# Patient Record
Sex: Female | Born: 2000 | Race: White | Hispanic: No | Marital: Single | Smoking: Former smoker
Health system: Southern US, Community
[De-identification: ages and names within clinical notes are randomized; demographics above are authoritative.]

## PROBLEM LIST (undated history)

## (undated) DIAGNOSIS — F32A Depression, unspecified: Secondary | ICD-10-CM

## (undated) DIAGNOSIS — J45909 Unspecified asthma, uncomplicated: Secondary | ICD-10-CM

## (undated) DIAGNOSIS — F419 Anxiety disorder, unspecified: Secondary | ICD-10-CM

## (undated) HISTORY — PX: NO PAST SURGERIES: SHX2092

## (undated) HISTORY — DX: Anxiety disorder, unspecified: F41.9

## (undated) HISTORY — DX: Depression, unspecified: F32.A

---
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
## 2019-04-05 ENCOUNTER — Emergency Department (INDEPENDENT_AMBULATORY_CARE_PROVIDER_SITE_OTHER)
Admission: EM | Admit: 2019-04-05 | Discharge: 2019-04-05 | Disposition: A | Discharge status: Home / Self Care | Payer: 59 | Source: Home / Self Care | Attending: Family Medicine | Admitting: Family Medicine

## 2019-04-05 ENCOUNTER — Encounter: Payer: Self-pay | Admitting: *Deleted

## 2019-04-05 ENCOUNTER — Other Ambulatory Visit: Payer: Self-pay

## 2019-04-05 DIAGNOSIS — J020 Streptococcal pharyngitis: Secondary | ICD-10-CM

## 2019-04-05 DIAGNOSIS — R05 Cough: Secondary | ICD-10-CM | POA: Diagnosis not present

## 2019-04-05 DIAGNOSIS — R059 Cough, unspecified: Secondary | ICD-10-CM

## 2019-04-05 HISTORY — DX: Unspecified asthma, uncomplicated: J45.909

## 2019-04-05 LAB — POCT RAPID STREP A (OFFICE): Rapid Strep A Screen: POSITIVE — AB

## 2019-04-05 MED ORDER — PENICILLIN V POTASSIUM 500 MG PO TABS: ORAL_TABLET | ORAL | 0 refills | Status: DC

## 2019-04-05 NOTE — ED Triage Notes (Signed)
Pt c/o cough and sore throat x 2 days. Denies fever.

## 2019-04-05 NOTE — Discharge Instructions (Addendum)
Take plain guaifenesin (1200mg  extended release tabs such as Mucinex) twice daily, with plenty of water, for cough and congestion.  May add Pseudoephedrine (30mg , one or two every 4 to 6 hours) for sinus congestion.  Get adequate rest.   May take Ibuprofen 200mg , 4 tabs every 8 hours with food for sore throat, body aches, etc. Try warm salt water gargles for sore throat.  May take Delsym Cough Suppressant at bedtime for nighttime cough.  Stop all antihistamines for now, and other non-prescription cough/cold preparations.

## 2019-04-05 NOTE — ED Provider Notes (Signed)
[facility]    CSN: [id number] Arrival date & time: 04/05/19  1221      History   Chief Complaint Chief Complaint  Patient presents with  . Cough    HPI Julitza Day is a 18 y.o. female.   Patient awoke with a sore throat 1 week ago that has persisted.  She has been fatigued and felt hot.  Three days ago she developed sinus congestion and mild cough.  She denies pleuritic pain, chest tightness and shortness of breath.  The history is provided by the patient.    Past Medical History:  Diagnosis Date  . Asthma     There are no active problems to display for this patient.   History reviewed. No pertinent surgical history.  OB History   No obstetric history on file.      Home Medications    Prior to Admission medications   Medication Sig Start Date End Date Taking? Authorizing Provider  ALBUTEROL IN Inhale into the lungs.    [provider]  Albuterol Sulfate (PROAIR RESPICLICK) 108 (90 Base) MCG/ACT AEPB Inhale 1-2 puffs into the lungs every 6 (six) hours as needed. 03/31/18   Lurene Shadow, PA-C  penicillin v potassium (VEETID) 500 MG tablet Take one tab by mouth twice daily for 10 days 04/05/19   Lattie Haw, MD    Family History History reviewed. No pertinent family history.  Social History Social History   Tobacco Use  . Smoking status: Never Smoker  . Smokeless tobacco: Never Used  Substance Use Topics  . Alcohol use: No  . Drug use: No     Allergies   Patient has no known allergies.   Review of Systems Review of Systems + sore throat + cough No pleuritic pain No wheezing + nasal congestion + post-nasal drainage No sinus pain/pressure No itchy/red eyes No earache No hemoptysis No SOB No fever, ? chills No nausea No vomiting No abdominal pain No diarrhea No urinary symptoms No skin rash + fatigue No myalgias + headache Used OTC meds without relief  Physical Exam Triage Vital Signs ED Triage  Vitals  Enc Vitals Group     BP 04/05/19 1234 111/75     Pulse Rate 04/05/19 1234 (!) 106     Resp 04/05/19 1234 16     Temp 04/05/19 1234 99 F (37.2 C)     Temp Source 04/05/19 1234 Oral     SpO2 04/05/19 1234 98 %     Weight 04/05/19 1235 110 lb (49.9 kg)     Height 04/05/19 1235 5\' 5"  (1.651 m)     Head Circumference --      Peak Flow --      Pain Score 04/05/19 1235 0     Pain Loc --      Pain Edu? --      Excl. in [place]? --    No data found.  Updated Vital Signs BP 111/75 (BP Location: Right Arm)   Pulse (!) 106   Temp 99 F (37.2 C) (Oral)   Resp 16   Ht 5\' 5"  (1.651 m)   Wt 49.9 kg   LMP 03/06/2019   SpO2 98%   BMI 18.30 kg/m   Visual Acuity Right Eye Distance:   Left Eye Distance:   Bilateral Distance:    Right Eye Near:   Left Eye Near:    Bilateral Near:     Physical Exam Nursing notes and Vital Signs reviewed. Appearance:  Patient appears stated age, and in no acute distress Eyes:  Pupils are equal, round, and reactive to light and accomodation.  Extraocular movement is intact.  Conjunctivae are not inflamed  Ears:  Canals normal.  Tympanic membranes normal.  Nose:  Mildly congested turbinates.  No sinus tenderness.  Pharynx:  Mildly erythematous Neck:  Supple.  Enlarged posterior/lateral nodes are palpated bilaterally, tender to palpation on the left.  Enlarged tender tonsillar nodes. Lungs:  Clear to auscultation.  Breath sounds are equal.  Moving air well. Heart:  Regular rate and rhythm without murmurs, rubs, or gallops.  Abdomen:  Nontender without masses or hepatosplenomegaly.  Bowel sounds are present.  No CVA or flank tenderness.  Extremities:  No edema.  Skin:  No rash present.    UC Treatments / Results  Labs (all labs ordered are listed, but only abnormal results are displayed) Labs Reviewed  POCT RAPID STREP A (OFFICE) - Abnormal; Notable for the following components:      Result Value   Rapid Strep A Screen Positive (*)    All other  components within normal limits  NOVEL CORONAVIRUS, NAA    EKG   Radiology No results found.  Procedures Procedures (including critical care time)  Medications Ordered in UC Medications - No data to display  Initial Impression / Assessment and Plan / UC Course  I have reviewed the triage vital signs and the nursing notes.  Pertinent labs & imaging results that were available during my care of the patient were reviewed by me and considered in my medical decision making (see chart for details).    Suspect viral URI in addition to strep.  Will check COVID19. Begin Pen VK Followup with Family Doctor if not improved in about 10 days   Final Clinical Impressions(s) / UC Diagnoses   Final diagnoses:  Cough  Streptococcus pharyngitis     Discharge Instructions     Take plain guaifenesin (1200mg  extended release tabs such as Mucinex) twice daily, with plenty of water, for cough and congestion.  May add Pseudoephedrine (30mg , one or two every 4 to 6 hours) for sinus congestion.  Get adequate rest.   May take Ibuprofen 200mg , 4 tabs every 8 hours with food for sore throat, body aches, etc. Try warm salt water gargles for sore throat.  May take Delsym Cough Suppressant at bedtime for nighttime cough.  Stop all antihistamines for now, and other non-prescription cough/cold preparations.       ED Prescriptions    Medication Sig Dispense Auth. Provider   penicillin v potassium (VEETID) 500 MG tablet Take one tab by mouth twice daily for 10 days 20 tablet Kandra Nicolas, MD        Kandra Nicolas, MD 04/05/19 1320

## 2019-04-08 LAB — NOVEL CORONAVIRUS, NAA: SARS-CoV-2, NAA: NOT DETECTED

## 2019-05-16 ENCOUNTER — Other Ambulatory Visit: Payer: Self-pay

## 2019-05-16 ENCOUNTER — Emergency Department (INDEPENDENT_AMBULATORY_CARE_PROVIDER_SITE_OTHER)
Admission: EM | Admit: 2019-05-16 | Discharge: 2019-05-16 | Disposition: A | Discharge status: Home / Self Care | Payer: 59 | Source: Home / Self Care

## 2019-05-16 DIAGNOSIS — J029 Acute pharyngitis, unspecified: Secondary | ICD-10-CM | POA: Diagnosis not present

## 2019-05-16 NOTE — ED Triage Notes (Signed)
Pt recently had strep that cleared up. Pt c/o another sore throat that started 2 days ago. Feels similar to the previous strep. Denies fever.

## 2019-05-16 NOTE — Discharge Instructions (Signed)
Covid test is pending.

## 2019-05-16 NOTE — ED Provider Notes (Signed)
[facility]    CSN: [id number] Arrival date & time: 05/16/19  1146      History   Chief Complaint Chief Complaint  Patient presents with  . Sore Throat    HPI Shirley Day is a 18 y.o. female.   The history is provided by the patient. No language interpreter was used.  Sore Throat This is a new problem. The current episode started 2 days ago. The problem occurs constantly. The problem has been gradually worsening. Pertinent negatives include no shortness of breath. Nothing aggravates the symptoms. Nothing relieves the symptoms. She has tried nothing for the symptoms. The treatment provided no relief.   Pt reports she has a sore throat.  Pt had strep a 2 weeks ago.  Pt reports her throat has been sore for 2 days  Past Medical History:  Diagnosis Date  . Asthma     There are no active problems to display for this patient.   History reviewed. No pertinent surgical history.  OB History   No obstetric history on file.      Home Medications    Prior to Admission medications   Medication Sig Start Date End Date Taking? Authorizing Provider  ALBUTEROL IN Inhale into the lungs.    [provider]  Albuterol Sulfate (PROAIR RESPICLICK) 354 (90 Base) MCG/ACT AEPB Inhale 1-2 puffs into the lungs every 6 (six) hours as needed. 03/31/18   Noe Gens, PA-C  penicillin v potassium (VEETID) 500 MG tablet Take one tab by mouth twice daily for 10 days 04/05/19   Kandra Nicolas, MD    Family History History reviewed. No pertinent family history.  Social History Social History   Tobacco Use  . Smoking status: Never Smoker  . Smokeless tobacco: Never Used  Substance Use Topics  . Alcohol use: No  . Drug use: No     Allergies   Patient has no known allergies.   Review of Systems Review of Systems  Respiratory: Negative for shortness of breath.   All other systems reviewed and are negative.    Physical Exam Triage Vital Signs ED  Triage Vitals  Enc Vitals Group     BP 05/16/19 1216 112/73     Pulse Rate 05/16/19 1216 90     Resp 05/16/19 1216 16     Temp 05/16/19 1216 98.5 F (36.9 C)     Temp Source 05/16/19 1216 Oral     SpO2 05/16/19 1216 98 %     Weight 05/16/19 1217 108 lb 0.4 oz (49 kg)     Height 05/16/19 1217 5\' 5"  (1.651 m)     Head Circumference --      Peak Flow --      Pain Score 05/16/19 1217 0     Pain Loc --      Pain Edu? --      Excl. in [place]? --    No data found.  Updated Vital Signs BP 112/73 (BP Location: Right Arm)   Pulse 90   Temp 98.5 F (36.9 C) (Oral)   Resp 16   Ht 5\' 5"  (1.651 m)   Wt 49 kg   SpO2 98%   BMI 17.98 kg/m   Visual Acuity Right Eye Distance:   Left Eye Distance:   Bilateral Distance:    Right Eye Near:   Left Eye Near:    Bilateral Near:     Physical Exam Vitals signs and nursing note reviewed.  Constitutional:  General: She is not in acute distress.    Appearance: She is well-developed.  HENT:     Head: Normocephalic and atraumatic.     Right Ear: Tympanic membrane normal.     Left Ear: Tympanic membrane normal.     Mouth/Throat:     Pharynx: Posterior oropharyngeal erythema present.  Eyes:     Conjunctiva/sclera: Conjunctivae normal.  Neck:     Musculoskeletal: Neck supple.  Cardiovascular:     Rate and Rhythm: Normal rate and regular rhythm.     Heart sounds: No murmur.  Pulmonary:     Effort: Pulmonary effort is normal. No respiratory distress.     Breath sounds: Normal breath sounds.  Abdominal:     Palpations: Abdomen is soft.     Tenderness: There is no abdominal tenderness.  Skin:    General: Skin is warm and dry.  Neurological:     General: No focal deficit present.     Mental Status: She is alert.      UC Treatments / Results  Labs (all labs ordered are listed, but only abnormal results are displayed) Labs Reviewed  STREP A DNA PROBE  NOVEL CORONAVIRUS, NAA  POCT RAPID STREP A (OFFICE)    EKG   Radiology  No results found.  Procedures Procedures (including critical care time)  Medications Ordered in UC Medications - No data to display  Initial Impression / Assessment and Plan / UC Course  I have reviewed the triage vital signs and the nursing notes.  Pertinent labs & imaging results that were available during my care of the patient were reviewed by me and considered in my medical decision making (see chart for details).     MDM: Strep is negative. Covid pending.  Pt advised warm salt water gargles, lozenges  Final Clinical Impressions(s) / UC Diagnoses   Final diagnoses:  Acute pharyngitis, unspecified etiology     Discharge Instructions     Covid test is pending    ED Prescriptions    None     PDMP not reviewed this encounter.  An After Visit Summary was printed and given to the patient.    Elson Areas, [place] 05/16/19 1325

## 2019-05-17 LAB — STREP A DNA PROBE: Group A Strep Probe: NOT DETECTED

## 2019-05-18 LAB — NOVEL CORONAVIRUS, NAA: SARS-CoV-2, NAA: NOT DETECTED

## 2020-08-14 ENCOUNTER — Emergency Department (INDEPENDENT_AMBULATORY_CARE_PROVIDER_SITE_OTHER): Payer: No Typology Code available for payment source

## 2020-08-14 ENCOUNTER — Emergency Department (INDEPENDENT_AMBULATORY_CARE_PROVIDER_SITE_OTHER)
Admission: EM | Admit: 2020-08-14 | Discharge: 2020-08-14 | Disposition: A | Discharge status: Home / Self Care | Payer: No Typology Code available for payment source | Source: Home / Self Care | Attending: Family Medicine | Admitting: Family Medicine

## 2020-08-14 ENCOUNTER — Other Ambulatory Visit: Payer: Self-pay

## 2020-08-14 ENCOUNTER — Encounter: Payer: Self-pay | Admitting: Emergency Medicine

## 2020-08-14 DIAGNOSIS — M940 Chondrocostal junction syndrome [Tietze]: Secondary | ICD-10-CM | POA: Diagnosis not present

## 2020-08-14 DIAGNOSIS — R0789 Other chest pain: Secondary | ICD-10-CM

## 2020-08-14 DIAGNOSIS — J029 Acute pharyngitis, unspecified: Secondary | ICD-10-CM | POA: Diagnosis not present

## 2020-08-14 DIAGNOSIS — Z8709 Personal history of other diseases of the respiratory system: Secondary | ICD-10-CM

## 2020-08-14 MED ORDER — PREDNISONE 20 MG PO TABS: ORAL_TABLET | ORAL | 0 refills | Status: DC

## 2020-08-14 NOTE — ED Provider Notes (Signed)
[facility]    CSN: [id number] Arrival date & time: 08/14/20  0903      History   Chief Complaint Chief Complaint  Patient presents with  . Sore Throat  . Chest Pain    HPI Shirley Day is a 20 y.o. female.   About two weeks ago patient awakened with a sore throat and tightness over her sternum, worse with inspiration.  Her sore throat resolved but she has persistent sternum pain, worse during the past two days, awakening her at night.  Ibuprofen is somewhat helpful.  She feels well otherwise without cough, fever, or URI symptoms.  The history is provided by the patient.    Past Medical History:  Diagnosis Date  . Asthma     There are no problems to display for this patient.   History reviewed. No pertinent surgical history.  OB History   No obstetric history on file.      Home Medications    Prior to Admission medications   Medication Sig Start Date End Date Taking? Authorizing Provider  predniSONE (DELTASONE) 20 MG tablet Take one tab by mouth twice daily for 4 days, then one daily. Take with food. 08/14/20  Yes Lattie Haw, MD  ALBUTEROL IN Inhale into the lungs. Patient not taking: Reported on 08/14/2020    [provider]  Albuterol Sulfate (PROAIR RESPICLICK) 108 (90 Base) MCG/ACT AEPB Inhale 1-2 puffs into the lungs every 6 (six) hours as needed. Patient not taking: Reported on 08/14/2020 03/31/18   Rolla Plate    Family History Family History  Problem Relation Age of Onset  . Hypertension Mother   . Healthy Father     Social History Social History   Tobacco Use  . Smoking status: Current Every Day Smoker    Types: E-cigarettes  . Smokeless tobacco: Never Used  Vaping Use  . Vaping Use: Every day  . Substances: Nicotine, Flavoring  Substance Use Topics  . Alcohol use: No  . Drug use: No     Allergies   Patient has no known allergies.   Review of Systems Review of Systems + sore throat, resolved No  cough No pleuritic pain, but has pain over sternum No wheezing No nasal congestion No post-nasal drainage No sinus pain/pressure No itchy/red eyes No earache No hemoptysis No SOB No fever/chills No nausea No vomiting No abdominal pain No diarrhea No urinary symptoms No skin rash No fatigue No myalgias No headache    Physical Exam Triage Vital Signs ED Triage Vitals  Enc Vitals Group     BP 08/14/20 0943 109/70     Pulse Rate 08/14/20 0943 68     Resp 08/14/20 0943 15     Temp 08/14/20 0943 98.5 F (36.9 C)     Temp Source 08/14/20 0943 Oral     SpO2 08/14/20 0943 100 %     Weight 08/14/20 0946 106 lb (48.1 kg)     Height 08/14/20 0946 5\' 6"  (1.676 m)     Head Circumference --      Peak Flow --      Pain Score 08/14/20 0945 4     Pain Loc --      Pain Edu? --      Excl. in [place]? --    No data found.  Updated Vital Signs BP 109/70 (BP Location: Right Arm)   Pulse 68   Temp 98.5 F (36.9 C) (Oral)   Resp 15   Ht  5\' 6"  (1.676 m)   Wt 48.1 kg   LMP 07/21/2020 (Exact Date)   SpO2 100%   BMI 17.11 kg/m   Visual Acuity Right Eye Distance:   Left Eye Distance:   Bilateral Distance:    Right Eye Near:   Left Eye Near:    Bilateral Near:     Physical Exam Vitals and nursing note reviewed.  Constitutional:      General: She is not in acute distress. HENT:     Head: Normocephalic.     Nose: Nose normal.     Mouth/Throat:     Pharynx: Oropharynx is clear.  Eyes:     Conjunctiva/sclera: Conjunctivae normal.     Pupils: Pupils are equal, round, and reactive to light.  Cardiovascular:     Rate and Rhythm: Normal rate.     Heart sounds: Normal heart sounds.     Comments: Regular rhythm with recurring premature beats Pulmonary:     Breath sounds: Normal breath sounds.  Chest:       Comments: Chest:  Distinct tenderness to palpation over the mid-sternum as noted on diagram.  There is tenderness to palpation over the pectoralis muscles with resisted  contraction of the pectoralis muscles. Abdominal:     Palpations: Abdomen is soft.     Tenderness: There is no abdominal tenderness.  Musculoskeletal:        General: No swelling or tenderness.     Right lower leg: No edema.     Left lower leg: No edema.  Skin:    General: Skin is warm and dry.     Findings: No rash.  Neurological:     Mental Status: She is alert and oriented to person, place, and time.      UC Treatments / Results  Labs (all labs ordered are listed, but only abnormal results are displayed) Labs Reviewed - No data to display  EKG   Radiology DG Chest 2 View  Result Date: 08/14/2020 CLINICAL DATA:  Sore throat and chest tightness upon waking up 2 weeks ago, midline anterior chest/sternal pain, pain increased with inspiration. History childhood asthma EXAM: CHEST - 2 VIEW COMPARISON:  07/21/2016 FINDINGS: Normal heart size, mediastinal contours, and pulmonary vascularity. Lungs clear. No acute infiltrate, pleural effusion, or pneumothorax. Osseous structures unremarkable. IMPRESSION: No acute abnormalities. Electronically Signed   By: 09/18/2016 M.D.   On: 08/14/2020 11:29    Procedures Procedures (including critical care time)  Medications Ordered in UC Medications - No data to display  Initial Impression / Assessment and Plan / UC Course  I have reviewed the triage vital signs and the nursing notes.  Pertinent labs & imaging results that were available during my care of the patient were reviewed by me and considered in my medical decision making (see chart for details).     Reassurance. Begin prednisone burst/taper. Followup with Family Doctor if not improved in about 2 weeks.   Final Clinical Impressions(s) / UC Diagnoses   Final diagnoses:  Costochondritis     Discharge Instructions     Apply ice pack to sternum for 20 to 30 minutes, 3 to 4 times daily  Continue until pain decreases.  Recommend follow-up with a cardiologist for evaluation of  extra heart beats.  If symptoms become significantly worse during the night or over the weekend, proceed to the local emergency room.     ED Prescriptions    Medication Sig Dispense Auth. Provider   predniSONE (DELTASONE) 20 MG tablet  Take one tab by mouth twice daily for 4 days, then one daily. Take with food. 12 tablet Lattie Haw, MD        Lattie Haw, MD 08/15/20 [phone]

## 2020-08-14 NOTE — ED Triage Notes (Addendum)
Sore throat & chest tightness on waking up x 2 weeks  Described as sternal pain - no radiation  Pain increase w/ inspiration Asthma at age 20 - resolved  COVID vaccine Moderna 02/24/20 - no booster

## 2020-08-14 NOTE — Discharge Instructions (Signed)
Apply ice pack to sternum for 20 to 30 minutes, 3 to 4 times daily  Continue until pain decreases.  Recommend follow-up with a cardiologist for evaluation of extra heart beats.  If symptoms become significantly worse during the night or over the weekend, proceed to the local emergency room.

## 2020-08-24 ENCOUNTER — Telehealth: Payer: Self-pay | Admitting: *Deleted

## 2020-08-24 ENCOUNTER — Ambulatory Visit (INDEPENDENT_AMBULATORY_CARE_PROVIDER_SITE_OTHER): Payer: No Typology Code available for payment source | Admitting: Medical-Surgical

## 2020-08-24 ENCOUNTER — Ambulatory Visit (INDEPENDENT_AMBULATORY_CARE_PROVIDER_SITE_OTHER): Payer: No Typology Code available for payment source

## 2020-08-24 ENCOUNTER — Encounter: Payer: Self-pay | Admitting: Medical-Surgical

## 2020-08-24 ENCOUNTER — Other Ambulatory Visit: Payer: Self-pay

## 2020-08-24 VITALS — BP 107/68 | HR 134 | Temp 98.5°F | Ht 65.0 in | Wt 108.0 lb

## 2020-08-24 DIAGNOSIS — Z7689 Persons encountering health services in other specified circumstances: Secondary | ICD-10-CM | POA: Diagnosis not present

## 2020-08-24 DIAGNOSIS — Z114 Encounter for screening for human immunodeficiency virus [HIV]: Secondary | ICD-10-CM

## 2020-08-24 DIAGNOSIS — R002 Palpitations: Secondary | ICD-10-CM | POA: Diagnosis not present

## 2020-08-24 DIAGNOSIS — R079 Chest pain, unspecified: Secondary | ICD-10-CM | POA: Diagnosis not present

## 2020-08-24 DIAGNOSIS — Z1159 Encounter for screening for other viral diseases: Secondary | ICD-10-CM

## 2020-08-24 NOTE — Progress Notes (Signed)
New Patient Office Visit  Subjective:  Patient ID: Shirley Day, female    DOB: 06-04-2001  Age: 20 y.o. MRN: [id number]  CC:  Chief Complaint  Patient presents with  . Establish Care    HPI Shirley Day presents to establish care.   Presented to UC last week for evaluation of chest pressure described as feeling like her heart was being squeezed. Had an EKG which showed sinus arrhythmia with short PR and PACs. No labs drawn. Was diagnosed with costochondritis and given a short burst of prednisone. She has completed the steroids but was told to follow up with her PCP. Today, she endorses no further chest pressure/pain but her heart rate is pretty elevated. She notes that she has trouble gaining weight and lost 30lbs last year abruptly without cause. Has ben experiencing hair loss and cold intolerance.    Past Medical History:  Diagnosis Date  . Asthma    History reviewed. No pertinent surgical history.  Family History  Problem Relation Age of Onset  . Hypertension Mother   . Healthy Father     Social History   Socioeconomic History  . Marital status: Single    Spouse name: Not on file  . Number of children: Not on file  . Years of education: Not on file  . Highest education level: Not on file  Occupational History  . Not on file  Tobacco Use  . Smoking status: Former Smoker    Types: E-cigarettes    Quit date: 07/27/2020    Years since quitting: 0.0  . Smokeless tobacco: Never Used  Vaping Use  . Vaping Use: Every day  . Substances: Nicotine, Flavoring  Substance and Sexual Activity  . Alcohol use: No  . Drug use: Never  . Sexual activity: Yes    Birth control/protection: Condom  Other Topics Concern  . Not on file  Social History Narrative  . Not on file   Social Determinants of Health   Financial Resource Strain: Not on file  Food Insecurity: Not on file  Transportation Needs: Not on file  Physical Activity: Not on file  Stress: Not on file  Social  Connections: Not on file  Intimate Partner Violence: Not on file    ROS Review of Systems  Constitutional: Negative for chills, fatigue and fever.  Eyes: Negative for visual disturbance.  Respiratory: Negative for cough, chest tightness, shortness of breath and wheezing.   Cardiovascular: Positive for chest pain (squeezing her heart) and palpitations. Negative for leg swelling.  Gastrointestinal: Negative for abdominal pain, constipation, diarrhea, nausea and vomiting.  Endocrine: Positive for cold intolerance. Negative for heat intolerance.  Genitourinary: Negative for dysuria, frequency and urgency.  Allergic/Immunologic: Negative for environmental allergies and food allergies.  Neurological: Positive for dizziness (occasional) and headaches (frequently for the past 3 weeks). Negative for light-headedness.  Hematological: Does not bruise/bleed easily.  Psychiatric/Behavioral: Negative for dysphoric mood, self-injury, sleep disturbance and suicidal ideas. The patient is not nervous/anxious.    Objective:   Today's Vitals: BP 107/68   Pulse (!) 134   Temp 98.5 F (36.9 C)   Ht 5\' 5"  (1.651 m)   Wt 108 lb (49 kg)   LMP 08/17/2020   SpO2 95%   BMI 17.97 kg/m   Physical Exam Vitals reviewed.  Constitutional:      General: She is not in acute distress.    Appearance: Normal appearance. She is not ill-appearing.  HENT:     Head: Normocephalic and atraumatic.  Cardiovascular:  Rate and Rhythm: Normal rate and regular rhythm.     Pulses: Normal pulses.     Heart sounds: Normal heart sounds. No murmur heard. No friction rub. No gallop.   Pulmonary:     Effort: Pulmonary effort is normal. No respiratory distress.     Breath sounds: Normal breath sounds. No wheezing.  Skin:    General: Skin is warm and dry.  Neurological:     Mental Status: She is alert and oriented to person, place, and time.  Psychiatric:        Mood and Affect: Mood normal.        Behavior: Behavior  normal.        Thought Content: Thought content normal.        Judgment: Judgment normal.    Assessment & Plan:   1. Encounter to establish care Reviewed available information and discussed health care concerns with patient.   2. Chest pain/palpitations Checking CBC w/diff, CMP, and TSH. EKG in office showing sinus tachycardia with HR of 108, normal rhythm, normal axis. ZioPatch monitor ordered. Referring to cardiology for further evaluation.  - CBC with Differential/Platelet - COMPLETE METABOLIC PANEL WITH GFR - TSH - EKG 12-Lead - LONG TERM MONITOR (3-14 DAYS); Future - Ambulatory referral to Cardiology  Outpatient Encounter Medications as of 08/24/2020  Medication Sig  . ALBUTEROL IN Inhale into the lungs.  . Albuterol Sulfate (PROAIR RESPICLICK) 108 (90 Base) MCG/ACT AEPB Inhale 1-2 puffs into the lungs every 6 (six) hours as needed.  . predniSONE (DELTASONE) 20 MG tablet Take one tab by mouth twice daily for 4 days, then one daily. Take with food.   No facility-administered encounter medications on file as of 08/24/2020.    Follow-up: Return if symptoms worsen or fail to improve.   Thayer Ohm, DNP, APRN, FNP-BC [facility]

## 2020-08-24 NOTE — Telephone Encounter (Signed)
Patient not available/voicemail box full. Called to inform patient Samuel Bouche, NP, requested we set her up with a 14 day patch long term holter monitor.  She has been enrolled for a company call [employer] to ship that monitor to her home via [employer].  Instructions will come with the monitor kit.

## 2020-08-25 LAB — COMPLETE METABOLIC PANEL WITH GFR
AG Ratio: 1.6 (calc) (ref 1.0–2.5)
ALT: 46 U/L — ABNORMAL HIGH (ref 6–29)
AST: 25 U/L (ref 10–30)
Albumin: 4.1 g/dL (ref 3.6–5.1)
Alkaline phosphatase (APISO): 74 U/L (ref 31–125)
BUN: 9 mg/dL (ref 7–25)
CO2: 28 mmol/L (ref 20–32)
Calcium: 9.3 mg/dL (ref 8.6–10.2)
Chloride: 103 mmol/L (ref 98–110)
Creat: 0.62 mg/dL (ref 0.50–1.10)
GFR, Est African American: 149 mL/min/{1.73_m2} (ref >=60)
GFR, Est Non African American: 129 mL/min/{1.73_m2} (ref >=60)
Globulin: 2.5 g/dL (calc) (ref 1.9–3.7)
Glucose, Bld: 85 mg/dL (ref 65–99)
Potassium: 3.8 mmol/L (ref 3.5–5.3)
Sodium: 142 mmol/L (ref 135–146)
Total Bilirubin: 0.3 mg/dL (ref 0.2–1.2)
Total Protein: 6.6 g/dL (ref 6.1–8.1)

## 2020-08-25 LAB — CBC WITH DIFFERENTIAL/PLATELET
Absolute Monocytes: 648 cells/uL (ref 200–950)
Basophils Absolute: 72 cells/uL (ref 0–200)
Basophils Relative: 0.9 %
Eosinophils Absolute: 448 cells/uL (ref 15–500)
Eosinophils Relative: 5.6 %
HCT: 45.2 % — ABNORMAL HIGH (ref 35.0–45.0)
Hemoglobin: 15 g/dL (ref 11.7–15.5)
Lymphs Abs: 1928 cells/uL (ref 850–3900)
MCH: 28.9 pg (ref 27.0–33.0)
MCHC: 33.2 g/dL (ref 32.0–36.0)
MCV: 87.1 fL (ref 80.0–100.0)
MPV: 11.5 fL (ref 7.5–12.5)
Monocytes Relative: 8.1 %
Neutro Abs: 4904 cells/uL (ref 1500–7800)
Neutrophils Relative %: 61.3 %
Platelets: 282 10*3/uL (ref 140–400)
RBC: 5.19 10*6/uL — ABNORMAL HIGH (ref 3.80–5.10)
RDW: 11.8 % (ref 11.0–15.0)
Total Lymphocyte: 24.1 %
WBC: 8 10*3/uL (ref 3.8–10.8)

## 2020-08-25 LAB — TSH: TSH: 1.1 mIU/L

## 2020-08-31 DIAGNOSIS — R002 Palpitations: Secondary | ICD-10-CM | POA: Diagnosis not present

## 2020-08-31 DIAGNOSIS — R079 Chest pain, unspecified: Secondary | ICD-10-CM

## 2020-09-25 ENCOUNTER — Emergency Department (INDEPENDENT_AMBULATORY_CARE_PROVIDER_SITE_OTHER): Payer: No Typology Code available for payment source

## 2020-09-25 ENCOUNTER — Emergency Department
Admission: EM | Admit: 2020-09-25 | Discharge: 2020-09-25 | Disposition: A | Discharge status: Home / Self Care | Payer: No Typology Code available for payment source | Source: Home / Self Care | Attending: Emergency Medicine | Admitting: Emergency Medicine

## 2020-09-25 ENCOUNTER — Other Ambulatory Visit (HOSPITAL_BASED_OUTPATIENT_CLINIC_OR_DEPARTMENT_OTHER): Payer: Self-pay

## 2020-09-25 ENCOUNTER — Other Ambulatory Visit: Payer: Self-pay

## 2020-09-25 DIAGNOSIS — W540XXA Bitten by dog, initial encounter: Secondary | ICD-10-CM

## 2020-09-25 DIAGNOSIS — S61451A Open bite of right hand, initial encounter: Secondary | ICD-10-CM | POA: Diagnosis not present

## 2020-09-25 DIAGNOSIS — S61411A Laceration without foreign body of right hand, initial encounter: Secondary | ICD-10-CM

## 2020-09-25 MED ORDER — IBUPROFEN 600 MG PO TABS
600.0000 mg | ORAL_TABLET | Freq: Four times a day (QID) | ORAL | 0 refills | Status: DC | PRN
  Filled 2020-09-25: qty 20, 5d supply, fill #0

## 2020-09-25 MED ORDER — IBUPROFEN 600 MG PO TABS
600.0000 mg | ORAL_TABLET | Freq: Once | ORAL | Status: AC
  Administered 2020-09-25: 600 mg via ORAL

## 2020-09-25 MED ORDER — HYDROCODONE-ACETAMINOPHEN 5-325 MG PO TABS
2.0000 | ORAL_TABLET | Freq: Four times a day (QID) | ORAL | 0 refills | Status: DC | PRN
  Filled 2020-09-25: qty 12, 2d supply, fill #0

## 2020-09-25 MED ORDER — AMOXICILLIN-POT CLAVULANATE 875-125 MG PO TABS
1.0000 | ORAL_TABLET | Freq: Two times a day (BID) | ORAL | 0 refills | Status: AC
  Filled 2020-09-25: qty 20, 10d supply, fill #0

## 2020-09-25 NOTE — ED Provider Notes (Signed)
HPI  SUBJECTIVE:  Shirley Day is a right-handed 20 y.o. female who presents with a dog bite to her right hand while trying to break up a dog fight.  This occurred 1 hour to arrival.  She was bitten by her own dog and states that all of its immunizations are up-to-date.  She reports constant throbbing pain at the 2 puncture wounds of her right palm.  No foreign body sensation, rash no numbness or tingling.  No limitation of motion of her fingers.  Denies injury to the dorsal aspect of her right hand.  She also has a cracked nail with a small subungual hematoma on her left thumb and a few scrapes on the dorsum of the left hand that is not bothering her.  She tried washing the wounds out with water without improvement of her symptoms.  No aggravating factors.  Past medical history of tachycardia.  No history of diabetes, smoking, immunocompromise, neuropathy.  LMP: Last week.  Denies the possibility of being pregnant.  Completed primary series of tetanus.  Tetanus is up-to-date.  VOH:YWVPXT, Joy, NP   Past Medical History:  Diagnosis Date  . Asthma     Past Surgical History:  Procedure Laterality Date  . NO PAST SURGERIES      Family History  Problem Relation Age of Onset  . Hypertension Mother   . Healthy Father   . Skin cancer Other   . Heart attack Other     Social History   Tobacco Use  . Smoking status: Former Smoker    Types: E-cigarettes    Quit date: 07/27/2020    Years since quitting: 0.1  . Smokeless tobacco: Never Used  Vaping Use  . Vaping Use: Former  Substance Use Topics  . Alcohol use: No  . Drug use: Never    No current facility-administered medications for this encounter.  Current Outpatient Medications:  .  amoxicillin-clavulanate (AUGMENTIN) 875-125 MG tablet, Take 1 tablet by mouth 2 (two) times daily for 10 days., Disp: 20 tablet, Rfl: 0 .  HYDROcodone-acetaminophen (NORCO/VICODIN) 5-325 MG tablet, Take 2 tablets by mouth every 6 (six) hours as needed  for moderate pain., Disp: 12 tablet, Rfl: 0 .  ibuprofen (ADVIL) 600 MG tablet, Take 1 tablet (600 mg total) by mouth every 6 (six) hours as needed., Disp: 20 tablet, Rfl: 0  No Known Allergies   ROS  As noted in HPI.   Physical Exam  BP 108/77 (BP Location: Right Arm)   Pulse 94   Temp 98.4 F (36.9 C)   Resp 20   Ht 5\' 5"  (1.651 m)   Wt 49.9 kg   SpO2 99%   BMI 18.30 kg/m   Constitutional: Well developed, well nourished, no acute distress Eyes:  EOMI, conjunctiva normal bilaterally HENT: Normocephalic, atraumatic,mucus membranes moist Respiratory: Normal inspiratory effort Cardiovascular: Normal rate GI: nondistended skin: No rash, skin intact Musculoskeletal: 1 cm laceration to palm of right hand with fat protruding.  0.5 cm linear laceration immediately next to it.  FDP/FDS intact in all fingers.  Mild localized tenderness at the bite site.  Two-point discrimination intact in all of the fingers.  No swelling.  No bony tenderness.  Rest of the hand exam normal.       Left thumb: Positive small nontender subungual hematoma.     Dorsum left hand: Multiple superficial scratches     Neurologic: Alert & oriented x 3, no focal neuro deficits Psychiatric: Speech and behavior appropriate   ED Course  Medications  ibuprofen (ADVIL) tablet 600 mg (600 mg Oral Given 09/25/20 1158)    Orders Placed This Encounter  Procedures  . DG Hand Complete Right    Standing Status:   Standing    Number of Occurrences:   1    Order Specific Question:   Reason for Exam (SYMPTOM  OR DIAGNOSIS REQUIRED)    Answer:   Dog bite, r/o fracture and foreign body    Order Specific Question:   Is patient pregnant?    Answer:   No  . Apply dressing    Standing Status:   Standing    Number of Occurrences:   1    No results found for this or any previous visit (from the past 24 hour(s)). DG Hand Complete Right  Result Date: 09/25/2020 CLINICAL DATA:  Dog bite.  Laceration to the  palm of the hand. EXAM: RIGHT HAND - COMPLETE 3+ VIEW COMPARISON:  12/21/2011 FINDINGS: No fracture or bone lesion. Joints normally spaced and aligned. Soft tissues are unremarkable.  No radiopaque foreign body. IMPRESSION: Negative. Electronically Signed   By: Amie Portland M.D.   On: 09/25/2020 12:31    ED Clinical Impression  1. Dog bite, initial encounter   2. Bite wound of right hand, initial encounter      ED Assessment/Plan  [place] Narcotic database reviewed for this patient, and feel that the risk/benefit ratio today is favorable for proceeding with a prescription for controlled substance.  No opiate prescriptions in 2 years.  Subungual hematoma left thumb noted, patient states this is not bothering her, thus no intervention done  Reviewed imaging independently.  Normal hand.  See radiology report for full details.  Procedure note: Cleaned area with alcohol.  Used 1.5 cc of 2% plain Xylocaine via local infiltration with adequate anesthesia.  Then had patient scrub her hand extensively with surgical soap and water.  Then debrided it, explored wounds with adequate hemostasis, no foreign body, neurovascular or tendon involvement noted.  Irrigated each wound out with 60 cc of sterile saline.  Pressure dressing placed.  Patient tolerated procedure well.  Patient with 2 puncture wounds from dog bite to her dominant right hand.  No foreign body or bone fracture seen on x-ray.  Home with Augmentin, ibuprofen/Tylenol-containing product, short course of Norco for severe pain.  patient to return here in 2 days for wound check.  ER return precautions given.  Discussed imaging, MDM, treatment plan, and plan for follow-up with patient. Discussed sn/sx that should prompt return to the ED. patient agrees with plan.   Meds ordered this encounter  Medications  . ibuprofen (ADVIL) tablet 600 mg  . amoxicillin-clavulanate (AUGMENTIN) 875-125 MG tablet    Sig: Take 1 tablet by mouth 2 (two) times daily for  10 days.    Dispense:  20 tablet    Refill:  0  . HYDROcodone-acetaminophen (NORCO/VICODIN) 5-325 MG tablet    Sig: Take 2 tablets by mouth every 6 (six) hours as needed for moderate pain.    Dispense:  12 tablet    Refill:  0  . ibuprofen (ADVIL) 600 MG tablet    Sig: Take 1 tablet (600 mg total) by mouth every 6 (six) hours as needed.    Dispense:  20 tablet    Refill:  0      *This clinic note was created using Scientist, clinical (histocompatibility and immunogenetics). Therefore, there may be occasional mistakes despite careful proofreading.  ?    Domenick Gong, MD 09/25/20 1944

## 2020-09-25 NOTE — Discharge Instructions (Addendum)
600 mg of ibuprofen combined with 1000 mg of Tylenol for mild to moderate pain or 600 mg of ibuprofen combined with 1-2 Norco for severe pain.  Finish the Augmentin, even if you feel better.  Keep this clean with soap and water.  Keep it covered while you are out and about and at night.  Give it some dry time so that it can heal.  We will see you in 2 days.

## 2020-09-25 NOTE — ED Triage Notes (Signed)
Pt presents to Urgent Care with dog bite to R hand. Reports she was trying to break up a fight between her dog and the neighbor's dog and was bit by her dog. Laceration noted to R palm and abrasion to R thumb.

## 2020-09-27 ENCOUNTER — Encounter: Payer: Self-pay | Admitting: Emergency Medicine

## 2020-09-27 ENCOUNTER — Emergency Department (INDEPENDENT_AMBULATORY_CARE_PROVIDER_SITE_OTHER)
Admission: EM | Admit: 2020-09-27 | Discharge: 2020-09-27 | Disposition: A | Discharge status: Home / Self Care | Payer: No Typology Code available for payment source | Source: Home / Self Care

## 2020-09-27 ENCOUNTER — Other Ambulatory Visit: Payer: Self-pay

## 2020-09-27 DIAGNOSIS — W540XXD Bitten by dog, subsequent encounter: Secondary | ICD-10-CM

## 2020-09-27 DIAGNOSIS — Z5189 Encounter for other specified aftercare: Secondary | ICD-10-CM | POA: Diagnosis not present

## 2020-09-27 NOTE — ED Notes (Signed)
R hand CDI - no bleeding noted - pt educated about timing of her antibiotic, has not taken 1st dose today- - no redness or streaks noted to R hand - bruising noted - pt educated on elevation to decrease swellling

## 2020-09-27 NOTE — Discharge Instructions (Addendum)
Area is healing well  Continue Augmentin  Follow up with this office or with primary care for increased swelling, redness, tenderness, warmth, drainage from the area.  Follow up in the ER for red streaking up your arm, high fever, trouble swallowing, trouble breathing, other concerning symptoms.

## 2020-09-27 NOTE — ED Provider Notes (Signed)
[facility]    CSN: [id number] Arrival date & time: 09/27/20  1245      History   Chief Complaint Chief Complaint  Patient presents with  . Wound Check    Dog bite R hand     HPI Shirley Day is a 20 y.o. female.   Reports that she is here to recheck the wound to her R hand from a dog bite x 2 days ago. Was seen in this office with normal xray of the hand and treated with Augmentin and Norco. Reports that she is still taking the antibiotic. Has acrylic fingernail to the L thumb with bruising of the nail underneath and states that this area is the most painful. Denies pain to the R hand, denies erythema, swelling, heat, tenderness, drainage from the area.  ROS per HPI  The history is provided by the patient.  Wound Check    Past Medical History:  Diagnosis Date  . Asthma     There are no problems to display for this patient.   Past Surgical History:  Procedure Laterality Date  . NO PAST SURGERIES      OB History   No obstetric history on file.      Home Medications    Prior to Admission medications   Medication Sig Start Date End Date Taking? Authorizing Provider  amoxicillin-clavulanate (AUGMENTIN) 875-125 MG tablet Take 1 tablet by mouth 2 (two) times daily for 10 days. 09/25/20 10/05/20 Yes Domenick Gong, MD  HYDROcodone-acetaminophen (NORCO/VICODIN) 5-325 MG tablet Take 2 tablets by mouth every 6 (six) hours as needed for moderate pain. 09/25/20  Yes Domenick Gong, MD  ibuprofen (ADVIL) 600 MG tablet Take 1 tablet (600 mg total) by mouth every 6 (six) hours as needed. 09/25/20  Yes Domenick Gong, MD    Family History Family History  Problem Relation Age of Onset  . Hypertension Mother   . Healthy Father   . Skin cancer Other   . Heart attack Other     Social History Social History   Tobacco Use  . Smoking status: Former Smoker    Types: E-cigarettes    Quit date: 07/27/2020    Years since quitting: 0.1  . Smokeless  tobacco: Never Used  Vaping Use  . Vaping Use: Former  Substance Use Topics  . Alcohol use: No  . Drug use: Never     Allergies   Patient has no known allergies.   Review of Systems Review of Systems   Physical Exam Triage Vital Signs ED Triage Vitals  Enc Vitals Group     BP      Pulse      Resp      Temp      Temp src      SpO2      Weight      Height      Head Circumference      Peak Flow      Pain Score      Pain Loc      Pain Edu?      Excl. in GC?    No data found.  Updated Vital Signs BP 101/69 (BP Location: Left Arm)   Pulse 95   Temp 98.3 F (36.8 C) (Oral)   Resp 16   LMP 09/20/2020 (Exact Date)   SpO2 95%   Visual Acuity Right Eye Distance:   Left Eye Distance:   Bilateral Distance:    Right Eye Near:   Left Eye  Near:    Bilateral Near:     Physical Exam Vitals and nursing note reviewed.  Constitutional:      General: She is not in acute distress.    Appearance: Normal appearance. She is well-developed. She is not ill-appearing.  HENT:     Head: Normocephalic and atraumatic.     Nose: Nose normal.     Mouth/Throat:     Mouth: Mucous membranes are moist.     Pharynx: Oropharynx is clear.  Eyes:     Extraocular Movements: Extraocular movements intact.     Conjunctiva/sclera: Conjunctivae normal.     Pupils: Pupils are equal, round, and reactive to light.  Cardiovascular:     Rate and Rhythm: Normal rate and regular rhythm.     Heart sounds: No murmur heard.   Pulmonary:     Effort: Pulmonary effort is normal. No respiratory distress.     Breath sounds: Normal breath sounds.  Abdominal:     Palpations: Abdomen is soft.     Tenderness: There is no abdominal tenderness.  Musculoskeletal:     Cervical back: Normal range of motion and neck supple.  Skin:    General: Skin is warm and dry.     Capillary Refill: Capillary refill takes less than 2 seconds.     Findings: Bruising and wound present.     Comments: Wound to R palmar  surface of the hand, 2 puncture wounds, no erythema, heat, tenderness, drainage from the area.  Bruising to the L thumb nail.  Neurological:     General: No focal deficit present.     Mental Status: She is alert and oriented to person, place, and time.  Psychiatric:        Mood and Affect: Mood normal.        Behavior: Behavior normal.        Thought Content: Thought content normal.      UC Treatments / Results  Labs (all labs ordered are listed, but only abnormal results are displayed) Labs Reviewed - No data to display  EKG   Radiology No results found.  Procedures Procedures (including critical care time)  Medications Ordered in UC Medications - No data to display  Initial Impression / Assessment and Plan / UC Course  I have reviewed the triage vital signs and the nursing notes.  Pertinent labs & imaging results that were available during my care of the patient were reviewed by me and considered in my medical decision making (see chart for details).    Puncture Wound Dog Bite  Area appears to be healing well without complication Continue Augmentin as prescribed Follow up with this office or with primary care for increased swelling, redness, tenderness, warmth, drainage from the area.  Follow up in the ER for red streaking up your hand, high fever, trouble swallowing, trouble breathing, other concerning symptoms.   Final Clinical Impressions(s) / UC Diagnoses   Final diagnoses:  Dog bite, subsequent encounter  Visit for wound check     Discharge Instructions     Area is healing well  Continue Augmentin  Follow up with this office or with primary care for increased swelling, redness, tenderness, warmth, drainage from the area.  Follow up in the ER for red streaking up your arm, high fever, trouble swallowing, trouble breathing, other concerning symptoms.      ED Prescriptions    None     PDMP not reviewed this encounter.   Moshe Cipro, NP 09/27/20 1431

## 2020-10-01 ENCOUNTER — Encounter: Payer: Self-pay | Admitting: Family Medicine

## 2020-10-01 ENCOUNTER — Ambulatory Visit (INDEPENDENT_AMBULATORY_CARE_PROVIDER_SITE_OTHER): Payer: No Typology Code available for payment source | Admitting: Family Medicine

## 2020-10-01 ENCOUNTER — Other Ambulatory Visit: Payer: Self-pay

## 2020-10-01 VITALS — BP 101/69 | HR 97 | Temp 98.5°F

## 2020-10-01 DIAGNOSIS — S61451A Open bite of right hand, initial encounter: Secondary | ICD-10-CM | POA: Diagnosis not present

## 2020-10-01 DIAGNOSIS — W540XXA Bitten by dog, initial encounter: Secondary | ICD-10-CM

## 2020-10-01 NOTE — Progress Notes (Addendum)
Acute Office Visit  Subjective:    Patient ID: Shirley Day, female    DOB: 02/03/2001, 20 y.o.   MRN: [id number]  No chief complaint on file.   HPI Patient is in today for dog bite.   Patient's hand was  bit by her dog on 09/25/20 while trying to break up a fight between her dog and the neighbor's dog. Her dog is up-to-date on immunizations. She was seen at urgent care the same day and wound was irrigated and she was given Augmentin and hydrocodone (see their note for pictures). 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 She has only been taking the hydrocodone once a day and then using the ibuprofen the rest of the time. She is also trying to ice it and elevate as able. She denies any bleeding, drainage, erythema, or increase in swelling - reports swelling is starting to go down nicely. She has noticed some itching and tingling as scabs are forming. She has full sensation in her fingers, but reports sometimes her pinky feels funny when she holds her hand straight up - seems to be getting a little better as time passes. Told her to keep an eye on this and let us know if anything persists or worsens.  Neighbor and her dog fighting, tried to separate. Got bit by her dog - up to date on immuizations/rabies. UC gave Augmentin, still taking. Aslo hydrocodone and ibuprofenn. Pain is currently about 6/10 throbbing to right hand.    Past Medical History:  Diagnosis Date  . Asthma     Past Surgical History:  Procedure Laterality Date  . NO PAST SURGERIES      Family History  Problem Relation Age of Onset  . Hypertension Mother   . Healthy Father   . Skin cancer Other   . Heart attack Other     Social History   Socioeconomic History  . Marital status: Single    Spouse name: Not on file  . Number of children: Not  on file  . Years of education: Not on file  . Highest education level: Not on file  Occupational History  . Not on file  Tobacco Use  . Smoking status: Former Smoker    Types: E-cigarettes    Quit date: 07/27/2020    Years since quitting: 0.1  . Smokeless tobacco: Never Used  Vaping Use  . Vaping Use: Former  Substance and Sexual Activity  . Alcohol use: No  . Drug use: Never  . Sexual activity: Yes    Birth control/protection: Condom  Other Topics Concern  . Not on file  Social History Narrative  . Not on file   Social Determinants of Health   Financial Resource Strain: Not on file  Food Insecurity: Not on file  Transportation Needs: Not on file  Physical Activity: Not on file  Stress: Not on file  Social Connections: Not on file  Intimate Partner Violence: Not on file    Outpatient Medications Prior to Visit  Medication Sig Dispense Refill  . amoxicillin-clavulanate (AUGMENTIN) 875-125 MG tablet Take 1 tablet by mouth 2 (two) times daily for 10 days. 20 tablet 0  . HYDROcodone-acetaminophen (NORCO/VICODIN) 5-325 MG tablet Take 2 tablets by mouth every 6 (six) hours as needed for moderate pain. 12 tablet 0  .  ibuprofen (ADVIL) 600 MG tablet Take 1 tablet (600 mg total) by mouth every 6 (six) hours as needed. 20 tablet 0   No facility-administered medications prior to visit.    No Known Allergies  Review of Systems All review of systems negative except what is listed in the HPI     Objective:    Physical Exam Vitals reviewed.  Constitutional:      Appearance: Normal appearance. She is normal weight.  Skin:    General: Skin is warm and dry.     Findings: No rash.     Comments: R palm with scabs from bite, no extensive erythema, mild edema, but improved significantly per patient, no extensive bruising, no drainage. Full sensation in all fingers,  Neurological:     Mental Status: She is alert.     LMP 09/20/2020 (Exact Date)  Wt Readings from Last 3  Encounters:  09/25/20 110 lb (49.9 kg)  08/24/20 108 lb (49 kg)  08/14/20 106 lb (48.1 kg)    Health Maintenance Due  Topic Date Due  . Hepatitis C Screening  Never done  . HIV Screening  Never done  . HPV VACCINES (3 - 3-dose series) 01/12/2015  . PAP-Cervical Cytology Screening  Never done  . PAP SMEAR-Modifier  Never done  . COVID-19 Vaccine (3 - Booster for Moderna series) 08/23/2020       Topic Date Due  . HPV VACCINES (3 - 3-dose series) 01/12/2015     Lab Results  Component Value Date   TSH 1.10 08/24/2020   Lab Results  Component Value Date   WBC 8.0 08/24/2020   HGB 15.0 08/24/2020   HCT 45.2 (H) 08/24/2020   MCV 87.1 08/24/2020   PLT 282 08/24/2020   Lab Results  Component Value Date   NA 142 08/24/2020   K 3.8 08/24/2020   CO2 28 08/24/2020   GLUCOSE 85 08/24/2020   BUN 9 08/24/2020   CREATININE 0.62 08/24/2020   BILITOT 0.3 08/24/2020   AST 25 08/24/2020   ALT 46 (H) 08/24/2020   PROT 6.6 08/24/2020   CALCIUM 9.3 08/24/2020   No results found for: CHOL No results found for: HDL No results found for: LDLCALC No results found for: TRIG No results found for: CHOLHDL No results found for: OZHY8M     Assessment & Plan:   1. Open bite of right hand without foreign body, initial encounter 2. Dog bite, initial encounter  Patient wanted to follow-up for the dog bite in her right hand. No signs of infection, but encouraged her to finish out the antibiotic. She uses her hands a lot at work (typing, carrying boxes, filing, etc) and states her hand has been throbbing the past 2 days - she reports the pain medicine seems to help and is not making her drowsy. X-ray was negative 09/25/20. Recommend she continue pain management regimen including oral analgesics, ice, elevation, and rest - she has been having a hard time resting it due to work. Encouraged her to ask for no right-handed duty tomorrow or take the day off so that she has the next 3 days or so to  truly rest it. Educated on signs and symptoms that would require urgent evaluation.   Follow-up if symptoms worsen or fail to improve.    Clayborne Dana, NP

## 2020-10-03 ENCOUNTER — Encounter: Payer: Self-pay | Admitting: Medical-Surgical

## 2020-10-05 MED ORDER — METOPROLOL TARTRATE 25 MG PO TABS: 12.5000 mg | ORAL_TABLET | Freq: Two times a day (BID) | ORAL | 0 refills | Status: DC | PRN

## 2020-10-15 ENCOUNTER — Ambulatory Visit: Payer: No Typology Code available for payment source | Admitting: Cardiology

## 2020-10-29 ENCOUNTER — Ambulatory Visit: Payer: No Typology Code available for payment source | Admitting: Cardiology

## 2020-10-29 NOTE — Progress Notes (Signed)
Cardiology Office Note   Date:  11/01/2020   ID:  Shirley Day, DOB [birth year], MRN [id number]  PCP:  Christen Butter, NP  Cardiologist:   None Referring:  Christen Butter, NP  Chief Complaint  Patient presents with  . Palpitations           History of Present Illness: Shirley Day is a 20 y.o. female who presents for evaluation of palpitations.  She was in urgent care in March and I reviewed these records for this appointment.  She had some chest discomfort that was felt ultimately to be costochondritis.  She was treated with prednisone which may have helped for short period but not long-term.  Eventually seem to go away.  She is not describing that discomfort now.  It was a sharp 8 out of 10 discomfort midsternal that would come and go lasting several minutes at a time.  The squeezing.  There were no associated symptoms.  She is also had palpitations.  These been going on for couple of years.  She will wake up often times with her heart beating fast and strong.  She did have a monitor which I was able to review.  She had premature ventricular contractions at times in a bigeminal pattern.  There was 1 run of 6 beats of nonsustained ventricular tachycardia.  She often felt these premature beats.  She has not had any syncope.  She was started on a beta-blocker but she is only been taking this rarely and as needed.  She is not overly active but she occasionally will play some soccer with her sister.  With this she denies any cardiovascular symptoms.  She is not having any chest pressure, neck or arm discomfort.  She is not having any new shortness of breath, PND or orthopnea.  Of note she did have weight loss couple of years ago that was somewhat traumatic. She was having some mental health issues at that time.  She has had normal potassium and TSH recently.  Past Medical History:  Diagnosis Date  . Asthma     Past Surgical History:  Procedure Laterality Date  . NO PAST SURGERIES        Current Outpatient Medications  Medication Sig Dispense Refill  . ibuprofen (ADVIL) 600 MG tablet Take 1 tablet (600 mg total) by mouth every 6 (six) hours as needed. 20 tablet 0  . metoprolol tartrate (LOPRESSOR) 25 MG tablet Take 0.5 tablets (12.5 mg total) by mouth 2 (two) times daily as needed (palpitations). 30 tablet 0   No current facility-administered medications for this visit.    Allergies:   Patient has no known allergies.    Social History:  The patient  reports that she quit smoking about 3 months ago. Her smoking use included e-cigarettes. She has never used smokeless tobacco. She reports that she does not drink alcohol and does not use drugs.   Family History:  The patient's family history includes Healthy in her father; Heart attack in an other family member; Hypertension in her mother; Skin cancer in an other family member.    ROS:  Please see the history of present illness.   Otherwise, review of systems are positive for none.   All other systems are reviewed and negative.    PHYSICAL EXAM: VS:  BP 100/63   Pulse 90   Ht 5\' 5"  (1.651 m)   Wt 105 lb 12.8 oz (48 kg)   SpO2 98%   BMI 17.61 kg/m  ,  BMI Body mass index is 17.61 kg/m. GENERAL:  Well appearing HEENT:  Pupils equal round and reactive, fundi not visualized, oral mucosa unremarkable NECK:  No jugular venous distention, waveform within normal limits, carotid upstroke brisk and symmetric, no bruits, no thyromegaly LYMPHATICS:  No cervical, inguinal adenopathy LUNGS:  Clear to auscultation bilaterally BACK:  No CVA tenderness CHEST:  Unremarkable HEART:  PMI not displaced or sustained,S1 and S2 within normal limits, no S3, no S4, no clicks, no rubs, no murmurs ABD:  Flat, positive bowel sounds normal in frequency in pitch, no bruits, no rebound, no guarding, no midline pulsatile mass, no hepatomegaly, no splenomegaly EXT:  2 plus pulses throughout, no edema, no cyanosis no clubbing SKIN:  No rashes  no nodules NEURO:  Cranial nerves II through XII grossly intact, motor grossly intact throughout, positive joint laxity PSYCH:  Cognitively intact, oriented to person place and time    EKG:  EKG is ordered today. The ekg ordered today demonstrates sinus rhythm, rate 90, axis within normal limits, intervals within normal limits, no acute ST-T wave changes.   Recent Labs: 08/24/2020: ALT 46; BUN 9; Creat 0.62; Hemoglobin 15.0; Platelets 282; Potassium 3.8; Sodium 142; TSH 1.10    Lipid Panel No results found for: CHOL, TRIG, HDL, CHOLHDL, VLDL, LDLCALC, LDLDIRECT    Wt Readings from Last 3 Encounters:  10/30/20 105 lb 12.8 oz (48 kg)  09/25/20 110 lb (49.9 kg)  08/24/20 108 lb (49 kg)      Other studies Reviewed: Additional studies/ records that were reviewed today include: Urgent care, event monitor strips personally reviewed. Review of the above records demonstrates:  Please see elsewhere in the note.     ASSESSMENT AND PLAN:  PVCs: She has had PVCs and nonsustained VT was noted.  I am going to check a magnesium, T3 and T4.  I will also follow-up with an echocardiogram.  She can continue with as needed beta-blocker.  Further evaluation will be based on these results and further symptoms.   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  I will be structurally assessing her heart as above.   Current medicines are reviewed at length with the patient today.  The patient does not have concerns regarding medicines.  The following changes have been made:  no change  Labs/ tests ordered today include:   Orders Placed This Encounter  Procedures  . T3  . T4  . Magnesium  . EKG 12-Lead  . ECHOCARDIOGRAM COMPLETE     Disposition:   FU with me in six weeks.     Signed, Rollene Rotunda, MD  11/01/2020 11:30 AM    [facility]

## 2020-10-30 ENCOUNTER — Other Ambulatory Visit: Payer: Self-pay

## 2020-10-30 ENCOUNTER — Encounter: Payer: Self-pay | Admitting: Cardiology

## 2020-10-30 ENCOUNTER — Ambulatory Visit (INDEPENDENT_AMBULATORY_CARE_PROVIDER_SITE_OTHER): Payer: No Typology Code available for payment source | Admitting: Cardiology

## 2020-10-30 VITALS — BP 100/63 | HR 90 | Ht 65.0 in | Wt 105.8 lb

## 2020-10-30 DIAGNOSIS — R002 Palpitations: Secondary | ICD-10-CM

## 2020-10-30 DIAGNOSIS — I472 Ventricular tachycardia: Secondary | ICD-10-CM

## 2020-10-30 DIAGNOSIS — I4729 Other ventricular tachycardia: Secondary | ICD-10-CM

## 2020-10-30 NOTE — Patient Instructions (Addendum)
Medication Instructions:  NO CHANGES   *If you need a refill on your cardiac medications before your next appointment, please call your pharmacy*  Lab Work: NEXT WEEK WHEN YOU GO FOR ECHO   If you have labs (blood work) drawn today and your tests are completely normal, you will receive your results only by: Marland Kitchen MyChart Message (if you have MyChart) OR . A paper copy in the mail If you have any lab test that is abnormal or we need to change your treatment, we will call you to review the results.  Testing/Procedures: Your physician has requested that you have an echocardiogram. Echocardiography is a painless test that uses sound waves to create images of your heart. It provides your doctor with information about the size and shape of your heart and how well your heart's chambers and valves are working. This procedure takes approximately one hour. There are no restrictions for this procedure. [facility] AT [address]  MAY 24 AT 11:35 AM   Follow-Up:   12/11/2020 AT 10:20 AM WITH DR [facility]   Other Instructions Echocardiogram An echocardiogram is a test that uses sound waves (ultrasound) to produce images of the heart. Images from an echocardiogram can provide important information about:  Heart size and shape.  The size and thickness and movement of your heart's walls.  Heart muscle function and strength.  Heart valve function or if you have stenosis. Stenosis is when the heart valves are too narrow.  If blood is flowing backward through the heart valves (regurgitation).  A tumor or infectious growth around the heart valves.  Areas of heart muscle that are not working well because of poor blood flow or injury from a heart attack.  Aneurysm detection. An aneurysm is a weak or damaged part of an artery wall. The wall bulges out from the normal force of blood pumping through the body. Tell a health care provider about:  Any allergies you have.  All medicines you  are taking, including vitamins, herbs, eye drops, creams, and over-the-counter medicines.  Any blood disorders you have.  Any surgeries you have had.  Any medical conditions you have.  Whether you are pregnant or may be pregnant. What are the risks? Generally, this is a safe test. However, problems may occur, including an allergic reaction to dye (contrast) that may be used during the test. What happens before the test? No specific preparation is needed. You may eat and drink normally. What happens during the test?  You will take off your clothes from the waist up and put on a hospital gown.  Electrodes or electrocardiogram (ECG)patches may be placed on your chest. The electrodes or patches are then connected to a device that monitors your heart rate and rhythm.  You will lie down on a table for an ultrasound exam. A gel will be applied to your chest to help sound waves pass through your skin.  A handheld device, called a transducer, will be pressed against your chest and moved over your heart. The transducer produces sound waves that travel to your heart and bounce back (or "echo" back) to the transducer. These sound waves will be captured in real-time and changed into images of your heart that can be viewed on a video monitor. The images will be recorded on a computer and reviewed by your health care provider.  You may be asked to change positions or hold your breath for a short time. This makes it easier to get  different views or better views of your heart.  In some cases, you may receive contrast through an IV in one of your veins. This can improve the quality of the pictures from your heart. The procedure may vary among health care providers and hospitals.   What can I expect after the test? You may return to your normal, everyday life, including diet, activities, and medicines, unless your health care provider tells you not to do that. Follow these instructions at home:  It is up  to you to get the results of your test. Ask your health care provider, or the department that is doing the test, when your results will be ready.  Keep all follow-up visits. This is important. Summary  An echocardiogram is a test that uses sound waves (ultrasound) to produce images of the heart.  Images from an echocardiogram can provide important information about the size and shape of your heart, heart muscle function, heart valve function, and other possible heart problems.  You do not need to do anything to prepare before this test. You may eat and drink normally.  After the echocardiogram is completed, you may return to your normal, everyday life, unless your health care provider tells you not to do that. This information is not intended to replace advice given to you by your health care provider. Make sure you discuss any questions you have with your health care provider. Document Revised: 01/21/2020 Document Reviewed: 01/21/2020 Elsevier Patient Education  2021 [employer].

## 2020-11-01 ENCOUNTER — Encounter: Payer: Self-pay | Admitting: Cardiology

## 2020-11-03 ENCOUNTER — Other Ambulatory Visit: Payer: Self-pay

## 2020-11-03 ENCOUNTER — Other Ambulatory Visit: Payer: No Typology Code available for payment source | Admitting: *Deleted

## 2020-11-03 ENCOUNTER — Ambulatory Visit (HOSPITAL_COMMUNITY): Payer: No Typology Code available for payment source | Attending: Internal Medicine

## 2020-11-03 DIAGNOSIS — I472 Ventricular tachycardia: Secondary | ICD-10-CM | POA: Insufficient documentation

## 2020-11-03 DIAGNOSIS — R002 Palpitations: Secondary | ICD-10-CM

## 2020-11-03 DIAGNOSIS — I4729 Other ventricular tachycardia: Secondary | ICD-10-CM

## 2020-11-03 LAB — ECHOCARDIOGRAM COMPLETE
Area-P 1/2: 5.66 cm2
S' Lateral: 2.5 cm

## 2020-11-03 LAB — T3: T3, Total: 157 ng/dL (ref 71–180)

## 2020-11-03 LAB — T4: T4, Total: 9.2 ug/dL (ref 4.5–12.0)

## 2020-11-03 LAB — MAGNESIUM: Magnesium: 2.2 mg/dL (ref 1.6–2.3)

## 2020-12-07 ENCOUNTER — Ambulatory Visit (INDEPENDENT_AMBULATORY_CARE_PROVIDER_SITE_OTHER): Payer: No Typology Code available for payment source | Admitting: Medical-Surgical

## 2020-12-07 ENCOUNTER — Other Ambulatory Visit: Payer: Self-pay

## 2020-12-07 ENCOUNTER — Encounter: Payer: Self-pay | Admitting: Medical-Surgical

## 2020-12-07 VITALS — BP 99/65 | HR 91 | Temp 98.6°F | Ht 65.0 in | Wt 104.0 lb

## 2020-12-07 DIAGNOSIS — F418 Other specified anxiety disorders: Secondary | ICD-10-CM | POA: Diagnosis not present

## 2020-12-07 MED ORDER — BUSPIRONE HCL 5 MG PO TABS: 5.0000 mg | ORAL_TABLET | Freq: Two times a day (BID) | ORAL | 1 refills | Status: DC | PRN

## 2020-12-07 NOTE — Patient Instructions (Signed)
Reasons why it is important to allow yourself to process and experience true feelings: When you numb sadness, you also numb happiness and Nathanyal Ashmead. Struggling with your emotions often leads to more suffering. Processing and experiencing your feelings is a part of having a full life.    Coping skills are things we can do to make ourselves feel better when we are going through difficult times. Examples of coping skills include:  Take a deep breath Count to 20 Listen to music Call a friend Take a walk Read a book Do a puzzle Meditate Journal Exercise Stretch Sing Bake Knit Go outside Garden Pray Color Send a note Take a bath Watch a movie Pet an animal Visit a friend Be alone in a quiet place   When your anxiety gets worse, try these grounding techniques:      If you need additional help, please contact your primary care provider or call one of the resources listed below:  [facility]  24-hour HelpLine at 336-832-9700 or 800-711-2635 [address] [place], [place] [address]  National [facility] Network 800-SUICIDE or 800-784-2433  The National Suicide Prevention Lifeline 800-273-TALK or 800-273-8255  [facility] https://www.celebraterecovery.com/   

## 2020-12-07 NOTE — Progress Notes (Signed)
Subjective:    CC: Depression/anxiety  HPI: Pleasant 20 year old female presenting today for discussion of depression and anxiety.  Notes that she has been having difficulty for several months.  She has not taken any medications for this before.  She has done a couple of counseling sessions when she was in college and felt this may have been somewhat helpful.  She has difficulty falling asleep and notes that at times she is afraid to miss something.  Once asleep, she has difficulty with sleeping too much and often is able to sleep for 12 hours at a time.  She has not tried any over-the-counter or herbal options and has never been prescribed medication to help with sleep.  She does endorse poor appetite intermittently and notes this has been happening over the last couple of years.  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  She does have to do 2 years at [employer] [place] college to get her prerequisites and then will need 2 years at [employer] before she finishes that program.  She is not currently doing classes for the summer but will start back in the fall.  Feels that she has poor motivation and is having difficulty getting things done.  Denies SI/HI.  I reviewed the past medical history, family history, social history, surgical history, and allergies today and no changes were needed.  Please see the problem list section below in epic for further details.  Past Medical History: Past Medical History:  Diagnosis Date   Asthma    Past Surgical History: Past Surgical History:  Procedure Laterality Date   NO PAST SURGERIES     Social History: Social History   Socioeconomic History   Marital status: Single    Spouse name: Not on file   Number of children: Not on file   Years of education: Not on file   Highest education level: Not on file  Occupational History   Not on file  Tobacco Use   Smoking status: Former     Pack years: 0.00    Types: E-cigarettes    Quit date: 07/27/2020    Years since quitting: 0.3   Smokeless tobacco: Never  Vaping Use   Vaping Use: Former  Substance and Sexual Activity   Alcohol use: No   Drug use: Never   Sexual activity: Yes    Birth control/protection: Condom  Other Topics Concern   Not on file  Social History Narrative   Not on file   Social Determinants of Health   Financial Resource Strain: Not on file  Food Insecurity: Not on file  Transportation Needs: Not on file  Physical Activity: Not on file  Stress: Not on file  Social Connections: Not on file   Family History: Family History  Problem Relation Age of Onset   Hypertension Mother    Healthy Father    Skin cancer Other    Heart attack Other    Allergies: No Known Allergies Medications: See med rec.  Review of Systems: See HPI for pertinent positives and negatives.   Objective:    General: Well Developed, well nourished, and in no acute distress.  Neuro: Alert and oriented x3.  HEENT: Normocephalic, atraumatic.  Skin: Warm and dry. Cardiac: Regular rate and rhythm, no murmurs rubs or gallops, no lower extremity edema.  Respiratory: Clear to auscultation bilaterally. Not using accessory muscles, speaking in full sentences.  Impression and Recommendations:    1.  Anxiety with depression Discussed all options with patient.  We are referring to behavioral health for counseling.  She would prefer to avoid a daily medication so sending in BuSpar 5 to 10 mg twice daily as needed.  Advised patient to take this regularly over the first week or so and then okay to take as needed.  If this is not helpful, we can consider switching to hydroxyzine but ultimately would benefit from a daily medication. - busPIRone (BUSPAR) 5 MG tablet; Take 1-2 tablets (5-10 mg total) by mouth 2 (two) times daily as needed.  Dispense: 120 tablet; Refill: 1 - Ambulatory referral to Behavioral Health  Return in about 4  weeks (around 01/04/2021) for mood follow up. ___________________________________________ Thayer Ohm, DNP, APRN, FNP-BC Primary Care and Sports Medicine [facility]

## 2020-12-09 NOTE — Progress Notes (Deleted)
Cardiology Office Note   Date:  12/09/2020   ID:  Shirley Day, DOB [birth year], MRN [id number]  PCP:  Christen Butter, NP  Cardiologist:   None Referring:  Christen Butter, NP  No chief complaint on file.     History of Present Illness: Shirley Day is a 20 y.o. female who presents for evaluation of palpitations.   She had PVCs.  After my first appt with her I ordered thyroid studies that were normal.  Echo was normal.  She is treated with prn beta blocker. ***   ***  She was in urgent care in March and I reviewed these records for this appointment.  She had some chest discomfort that was felt ultimately to be costochondritis.  She was treated with prednisone which may have helped for short period but not long-term.  Eventually seem to go away.  She is not describing that discomfort now.  It was a sharp 8 out of 10 discomfort midsternal that would come and go lasting several minutes at a time.  The squeezing.  There were no associated symptoms.  She is also had palpitations.  These been going on for couple of years.  She will wake up often times with her heart beating fast and strong.  She did have a monitor which I was able to review.  She had premature ventricular contractions at times in a bigeminal pattern.  There was 1 run of 6 beats of nonsustained ventricular tachycardia.  She often felt these premature beats.  She has not had any syncope.  She was started on a beta-blocker but she is only been taking this rarely and as needed.  She is not overly active but she occasionally will play some soccer with her sister.  With this she denies any cardiovascular symptoms.  She is not having any chest pressure, neck or arm discomfort.  She is not having any new shortness of breath, PND or orthopnea.  Of note she did have weight loss couple of years ago that was somewhat traumatic. She was having some mental health issues at that time.  She has had normal potassium and TSH recently.  Past  Medical History:  Diagnosis Date   Asthma     Past Surgical History:  Procedure Laterality Date   NO PAST SURGERIES       Current Outpatient Medications  Medication Sig Dispense Refill   busPIRone (BUSPAR) 5 MG tablet Take 1-2 tablets (5-10 mg total) by mouth 2 (two) times daily as needed. 120 tablet 1   ibuprofen (ADVIL) 600 MG tablet Take 1 tablet (600 mg total) by mouth every 6 (six) hours as needed. 20 tablet 0   metoprolol tartrate (LOPRESSOR) 25 MG tablet Take 0.5 tablets (12.5 mg total) by mouth 2 (two) times daily as needed (palpitations). 30 tablet 0   No current facility-administered medications for this visit.    Allergies:   Patient has no known allergies.    ROS:  Please see the history of present illness.   Otherwise, review of systems are positive for ***.   All other systems are reviewed and negative.    PHYSICAL EXAM: VS:  There were no vitals taken for this visit. , BMI There is no height or weight on file to calculate BMI. GENERAL:  Well appearing NECK:  No jugular venous distention, waveform within normal limits, carotid upstroke brisk and symmetric, no bruits, no thyromegaly LUNGS:  Clear to auscultation bilaterally CHEST:  Unremarkable HEART:  PMI not displaced  or sustained,S1 and S2 within normal limits, no S3, no S4, no clicks, no rubs, *** murmurs ABD:  Flat, positive bowel sounds normal in frequency in pitch, no bruits, no rebound, no guarding, no midline pulsatile mass, no hepatomegaly, no splenomegaly EXT:  2 plus pulses throughout, no edema, no cyanosis no clubbing     ***GENERAL:  Well appearing HEENT:  Pupils equal round and reactive, fundi not visualized, oral mucosa unremarkable NECK:  No jugular venous distention, waveform within normal limits, carotid upstroke brisk and symmetric, no bruits, no thyromegaly LYMPHATICS:  No cervical, inguinal adenopathy LUNGS:  Clear to auscultation bilaterally BACK:  No CVA tenderness CHEST:   Unremarkable HEART:  PMI not displaced or sustained,S1 and S2 within normal limits, no S3, no S4, no clicks, no rubs, no murmurs ABD:  Flat, positive bowel sounds normal in frequency in pitch, no bruits, no rebound, no guarding, no midline pulsatile mass, no hepatomegaly, no splenomegaly EXT:  2 plus pulses throughout, no edema, no cyanosis no clubbing SKIN:  No rashes no nodules NEURO:  Cranial nerves II through XII grossly intact, motor grossly intact throughout, positive joint laxity PSYCH:  Cognitively intact, oriented to person place and time    EKG:  EKG is *** ordered today. The ekg ordered today demonstrates sinus rhythm, rate ***, axis within normal limits, intervals within normal limits, no acute ST-T wave changes.   Recent Labs: 08/24/2020: ALT 46; BUN 9; Creat 0.62; Hemoglobin 15.0; Platelets 282; Potassium 3.8; Sodium 142; TSH 1.10 11/03/2020: Magnesium 2.2    Lipid Panel No results found for: CHOL, TRIG, HDL, CHOLHDL, VLDL, LDLCALC, LDLDIRECT    Wt Readings from Last 3 Encounters:  12/07/20 104 lb (47.2 kg)  10/30/20 105 lb 12.8 oz (48 kg)  09/25/20 110 lb (49.9 kg)      Other studies Reviewed: Additional studies/ records that were reviewed today include: *** Review of the above records demonstrates:  Please see elsewhere in the note.     ASSESSMENT AND PLAN:  PVCs:  ***   She has had PVCs and nonsustained VT was noted.  I am going to check a magnesium, T3 and T4.  I will also follow-up with an echocardiogram.  She can continue with as needed beta-blocker.  Further evaluation will be based on these results and further symptoms.   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  I will be structurally assessing her heart as above.   Current medicines are reviewed at length with the patient today.  The patient does not have concerns regarding medicines.  The  following changes have been made:  ***  Labs/ tests ordered today include:   No orders of the defined types were placed in this encounter.    Disposition:   FU with me in ***    Signed, Rollene Rotunda, MD  12/09/2020 9:40 PM    [facility]

## 2020-12-11 ENCOUNTER — Ambulatory Visit: Payer: No Typology Code available for payment source | Admitting: Cardiology

## 2021-01-04 ENCOUNTER — Ambulatory Visit: Payer: No Typology Code available for payment source | Admitting: Medical-Surgical

## 2021-01-06 ENCOUNTER — Other Ambulatory Visit: Payer: Self-pay

## 2021-01-06 ENCOUNTER — Ambulatory Visit (INDEPENDENT_AMBULATORY_CARE_PROVIDER_SITE_OTHER): Payer: No Typology Code available for payment source | Admitting: Medical-Surgical

## 2021-01-06 ENCOUNTER — Encounter: Payer: Self-pay | Admitting: Medical-Surgical

## 2021-01-06 VITALS — BP 93/58 | HR 61 | Resp 20 | Ht 65.0 in | Wt 105.6 lb

## 2021-01-06 DIAGNOSIS — F418 Other specified anxiety disorders: Secondary | ICD-10-CM | POA: Diagnosis not present

## 2021-01-06 DIAGNOSIS — F4010 Social phobia, unspecified: Secondary | ICD-10-CM | POA: Insufficient documentation

## 2021-01-06 MED ORDER — FLUOXETINE HCL 10 MG PO CAPS: ORAL_CAPSULE | ORAL | 0 refills | Status: DC

## 2021-01-06 NOTE — Progress Notes (Signed)
  HPI with pertinent ROS:   CC: Anxiety/depression  HPI: Pleasant 20 year old female presenting today for follow-up on anxiety depression.  She was started on BuSpar 5 mg twice daily as needed at her last visit.  She has taken this medication as prescribed but is only averaging 1 dose per day.  She feels that the medication is not really working for her and tends to make her feel more depressed than anything.  Describes her anxiety as more social in nature.  She is fine at home as well as when she is out by herself but anxiety starts when she is forced to interact with individuals in person.  She is able to interact with her social group online and via text/phone call with no anxiety.  When she does get anxious, she does have intermittent chest pains along with shortness of breath and palpitations.  Denies SI/HI.  I reviewed the past medical history, family history, social history, surgical history, and allergies today and no changes were needed.  Please see the problem list section below in epic for further details.  Depression screen [facility] 2/9 01/06/2021 12/07/2020 08/24/2020  Decreased Interest 1 3 0  Down, Depressed, Hopeless 1 2 0  PHQ - 2 Score 2 5 0  Altered sleeping 1 3 1   Tired, decreased energy 1 3 1   Change in appetite 1 3 1   Feeling bad or failure about yourself  0 2 0  Trouble concentrating 1 1 0  Moving slowly or fidgety/restless 0 1 0  Suicidal thoughts 0 0 0  PHQ-9 Score 6 18 3   Difficult doing work/chores Not difficult at all Not difficult at all Not difficult at all   GAD 7 : Generalized Anxiety Score 01/06/2021 12/07/2020 08/24/2020  Nervous, Anxious, on Edge 1 2 1   Control/stop worrying 1 1 1   Worry too much - different things 1 3 1   Trouble relaxing 1 1 0  Restless 1 2 0  Easily annoyed or irritable 1 2 1   Afraid - awful might happen 0 3 0  Total GAD 7 Score 6 14 4   Anxiety Difficulty Not difficult at all Not difficult at all Not difficult at all   Physical exam:    General: Well Developed, well nourished, and in no acute distress.  Neuro: Alert and oriented x 3.  HEENT: Normocephalic, atraumatic.  Skin: Warm and dry. Cardiac: Regular rate and rhythm.  Respiratory: Not using accessory muscles, speaking in full sentences.  Impression and Recommendations:    1. Social anxiety disorder 2. Anxiety with depression Discussed various options for treatment.  She does have quite an improvement in PHQ/GAD-7 scores even with just BuSpar once daily.  Since she is not tolerating the BuSpar very well and feels more depressed, discussed starting a daily medication.  She is agreeable so we will start with fluoxetine 10 mg daily for the first week and then increase to 20 mg daily.  Discussed expectations for effectiveness of the medication and possible side effects.  Warned against the risk of worsening thoughts of self-harm/SI.  Ok to continue as needed BuSpar if desired.  Return in about 4 weeks (around 02/03/2021) for mood follow up (virtual is fine). ___________________________________________ 01/08/2021, DNP, APRN, FNP-BC Primary Care and Sports Medicine [facility]

## 2021-01-10 ENCOUNTER — Encounter: Payer: Self-pay | Admitting: Medical-Surgical

## 2021-01-26 ENCOUNTER — Other Ambulatory Visit: Payer: Self-pay | Admitting: Medical-Surgical

## 2021-01-26 NOTE — Telephone Encounter (Signed)
Based on last office notes, do you want to do 20mg  daily now? Instead of the 10mg  for 7 days then 20mg ?

## 2021-02-03 ENCOUNTER — Encounter: Payer: Self-pay | Admitting: Medical-Surgical

## 2021-02-03 ENCOUNTER — Telehealth (INDEPENDENT_AMBULATORY_CARE_PROVIDER_SITE_OTHER): Payer: No Typology Code available for payment source | Admitting: Medical-Surgical

## 2021-02-03 DIAGNOSIS — F4010 Social phobia, unspecified: Secondary | ICD-10-CM | POA: Diagnosis not present

## 2021-02-03 DIAGNOSIS — F418 Other specified anxiety disorders: Secondary | ICD-10-CM | POA: Diagnosis not present

## 2021-02-03 MED ORDER — FLUOXETINE HCL 20 MG PO CAPS: 20.0000 mg | ORAL_CAPSULE | Freq: Every day | ORAL | 3 refills | Status: DC

## 2021-02-03 MED ORDER — BUSPIRONE HCL 5 MG PO TABS: 5.0000 mg | ORAL_TABLET | Freq: Two times a day (BID) | ORAL | 1 refills | Status: DC | PRN

## 2021-02-03 NOTE — Progress Notes (Signed)
Virtual Visit via Video Note  I connected with Shirley Day on 02/03/21 at  9:30 AM EDT by a video enabled telemedicine application and verified that I am speaking with the correct person using two identifiers.   I discussed the limitations of evaluation and management by telemedicine and the availability of in person appointments. The patient expressed understanding and agreed to proceed.  Patient location: home Provider locations: office  Subjective:    CC: Mood follow-up  HPI: Very pleasant 20 year old female presenting via MyChart video visit for mood follow-up.  She does suffer from social anxiety disorder and was having significant difficulty interacting with others.  We started her on fluoxetine and she has been taking this as prescribed at 20 mg daily for the last few weeks.  We started at 10 mg daily for 1 week and she had no significant symptoms.  She is not having any side effects from the 20 mg although she did have a period of itching that has since resolved.  Feels that the fluoxetine is helping pretty well with her symptoms although she does have some good days and some bad days regarding the social anxiety.  She is using BuSpar 5 mg daily along with the fluoxetine which she feels is also helpful.  She has not tried taking this more than once a day.  Denies SI/HI.  Past medical history, Surgical history, Family history not pertinant except as noted below, Social history, Allergies, and medications have been entered into the medical record, reviewed, and corrections made.   Review of Systems: See HPI for pertinent positives and negatives.   Depression screen [facility] 2/9 02/03/2021 01/06/2021 12/07/2020 08/24/2020  Decreased Interest 0 1 3 0  Down, Depressed, Hopeless 0 1 2 0  PHQ - 2 Score 0 2 5 0  Altered sleeping 1 1 3 1   Tired, decreased energy 1 1 3 1   Change in appetite 0 1 3 1   Feeling bad or failure about yourself  0 0 2 0  Trouble concentrating 1 1 1  0  Moving slowly or  fidgety/restless 0 0 1 0  Suicidal thoughts 0 0 0 0  PHQ-9 Score 3 6 18 3   Difficult doing work/chores Not difficult at all Not difficult at all Not difficult at all Not difficult at all   GAD 7 : Generalized Anxiety Score 02/03/2021 01/06/2021 12/07/2020 08/24/2020  Nervous, Anxious, on Edge 1 1 2 1   Control/stop worrying 1 1 1 1   Worry too much - different things 1 1 3 1   Trouble relaxing 1 1 1  0  Restless 0 1 2 0  Easily annoyed or irritable 1 1 2 1   Afraid - awful might happen 1 0 3 0  Total GAD 7 Score 6 6 14 4   Anxiety Difficulty Somewhat difficult Not difficult at all Not difficult at all Not difficult at all   Objective:    General: Speaking clearly in complete sentences without any shortness of breath.  Alert and oriented x3.  Normal judgment. No apparent acute distress.  Impression and Recommendations:    1. Social anxiety disorder 2. Anxiety with depression Symptoms are much better with the use of fluoxetine so continue 20 mg daily.  Continue buspirone 5 mg but consider using this twice daily to see if it gives better control over anxiety.  Discussed measures to take at home to help with social anxiety.  Encourage patient to continue seeking opportunities to interact socially with others in a form of exposure therapy with  the understanding that each successful interaction will help reduce anxiety.   I discussed the assessment and treatment plan with the patient. The patient was provided an opportunity to ask questions and all were answered. The patient agreed with the plan and demonstrated an understanding of the instructions.   The patient was advised to call back or seek an in-person evaluation if the symptoms worsen or if the condition fails to improve as anticipated.  20 minutes of non-face-to-face time was provided during this encounter.  Return in about 6 months (around 08/06/2021) for mood follow up.  Thayer Ohm, DNP, APRN, FNP-BC  [facility]

## 2021-02-03 NOTE — Progress Notes (Signed)
Feels like the prozac is helping PHQ3 GAD6

## 2021-02-08 DIAGNOSIS — I493 Ventricular premature depolarization: Secondary | ICD-10-CM | POA: Insufficient documentation

## 2021-02-08 NOTE — Progress Notes (Deleted)
Cardiology Office Note   Date:  02/08/2021   ID:  Shirley Day, DOB [birth year], MRN [id number]  PCP:  Christen Butter, NP  Cardiologist:   None Referring:  Christen Butter, NP  No chief complaint on file.     History of Present Illness: Shirley Day is a 20 y.o. female who presents for evaluation of palpitations.  She was in urgent care in March and I reviewed these records for this appointment.  She had some chest discomfort that was felt ultimately to be costochondritis.  She was treated with prednisone which may have helped for short period but not long-term.  Eventually seem to go away.  She is not describing that discomfort now.  It was a sharp 8 out of 10 discomfort midsternal that would come and go lasting several minutes at a time.  The squeezing.  There were no associated symptoms.  I saw her previously for evaluation of palpitations.   I sent her for an evnet monitor.  She had she had rare PVCs and PACs.   EF was normal and echo was otherwise unremarkable.    ***   *** She is also had palpitations.  These been going on for couple of years.  She will wake up often times with her heart beating fast and strong.  She did have a monitor which I was able to review.  She had premature ventricular contractions at times in a bigeminal pattern.  There was 1 run of 6 beats of nonsustained ventricular tachycardia.  She often felt these premature beats.  She has not had any syncope.  She was started on a beta-blocker but she is only been taking this rarely and as needed.  She is not overly active but she occasionally will play some soccer with her sister.  With this she denies any cardiovascular symptoms.  She is not having any chest pressure, neck or arm discomfort.  She is not having any new shortness of breath, PND or orthopnea.  Of note she did have weight loss couple of years ago that was somewhat traumatic. She was having some mental health issues at that time.  She has had normal  potassium and TSH recently.  Past Medical History:  Diagnosis Date   Asthma     Past Surgical History:  Procedure Laterality Date   NO PAST SURGERIES       Current Outpatient Medications  Medication Sig Dispense Refill   busPIRone (BUSPAR) 5 MG tablet Take 1-2 tablets (5-10 mg total) by mouth 2 (two) times daily as needed. 120 tablet 1   FLUoxetine (PROZAC) 20 MG capsule Take 1 capsule (20 mg total) by mouth daily. 90 capsule 3   No current facility-administered medications for this visit.    Allergies:   Patient has no known allergies.     ROS:  Please see the history of present illness.   Otherwise, review of systems are positive for ***.   All other systems are reviewed and negative.    PHYSICAL EXAM: VS:  There were no vitals taken for this visit. , BMI There is no height or weight on file to calculate BMI. GENERAL:  Well appearing NECK:  No jugular venous distention, waveform within normal limits, carotid upstroke brisk and symmetric, no bruits, no thyromegaly LUNGS:  Clear to auscultation bilaterally CHEST:  Unremarkable HEART:  PMI not displaced or sustained,S1 and S2 within normal limits, no S3, no S4, no clicks, no rubs, *** murmurs ABD:  Flat, positive bowel  sounds normal in frequency in pitch, no bruits, no rebound, no guarding, no midline pulsatile mass, no hepatomegaly, no splenomegaly EXT:  2 plus pulses throughout, no edema, no cyanosis no clubbing    ***GENERAL:  Well appearing HEENT:  Pupils equal round and reactive, fundi not visualized, oral mucosa unremarkable NECK:  No jugular venous distention, waveform within normal limits, carotid upstroke brisk and symmetric, no bruits, no thyromegaly LYMPHATICS:  No cervical, inguinal adenopathy LUNGS:  Clear to auscultation bilaterally BACK:  No CVA tenderness CHEST:  Unremarkable HEART:  PMI not displaced or sustained,S1 and S2 within normal limits, no S3, no S4, no clicks, no rubs, no murmurs ABD:  Flat,  positive bowel sounds normal in frequency in pitch, no bruits, no rebound, no guarding, no midline pulsatile mass, no hepatomegaly, no splenomegaly EXT:  2 plus pulses throughout, no edema, no cyanosis no clubbing SKIN:  No rashes no nodules NEURO:  Cranial nerves II through XII grossly intact, motor grossly intact throughout, positive joint laxity PSYCH:  Cognitively intact, oriented to person place and time    EKG:  EKG is *** ordered today. The ekg ordered today demonstrates sinus rhythm, rate ***, axis within normal limits, intervals within normal limits, no acute ST-T wave changes.   Recent Labs: 08/24/2020: ALT 46; BUN 9; Creat 0.62; Hemoglobin 15.0; Platelets 282; Potassium 3.8; Sodium 142; TSH 1.10 11/03/2020: Magnesium 2.2    Lipid Panel No results found for: CHOL, TRIG, HDL, CHOLHDL, VLDL, LDLCALC, LDLDIRECT    Wt Readings from Last 3 Encounters:  01/06/21 105 lb 9.6 oz (47.9 kg)  12/07/20 104 lb (47.2 kg)  10/30/20 105 lb 12.8 oz (48 kg)      Other studies Reviewed: Additional studies/ records that were reviewed today include: *** Review of the above records demonstrates:  Please see elsewhere in the note.     ASSESSMENT AND PLAN:  PVCs: ***  She has had PVCs and nonsustained VT was noted.  I am going to check a magnesium, T3 and T4.  I will also follow-up with an echocardiogram.  She can continue with as needed beta-blocker.  Further evaluation will be based on these results and further symptoms.   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  I will be structurally assessing her heart as above.   Current medicines are reviewed at length with the patient today.  The patient does not have concerns regarding medicines.  The following changes have been made:  ***  Labs/ tests ordered today include: ***  No orders of the defined types were placed in this  encounter.    Disposition:   FU with me in ***   Signed, Rollene Rotunda, MD  02/08/2021 9:22 PM    [facility]

## 2021-02-09 ENCOUNTER — Telehealth: Payer: Self-pay | Admitting: *Deleted

## 2021-02-09 ENCOUNTER — Other Ambulatory Visit: Payer: Self-pay

## 2021-02-09 ENCOUNTER — Ambulatory Visit: Payer: No Typology Code available for payment source | Admitting: Cardiology

## 2021-02-09 DIAGNOSIS — I493 Ventricular premature depolarization: Secondary | ICD-10-CM

## 2021-02-09 NOTE — Telephone Encounter (Signed)
Left message to call back to see if she was going to make her 3:40 appointment today with Dr Antoine Poche.  Direct line phone given

## 2021-03-11 ENCOUNTER — Other Ambulatory Visit: Payer: Self-pay | Admitting: Medical-Surgical

## 2021-03-15 ENCOUNTER — Other Ambulatory Visit: Payer: Self-pay

## 2021-03-15 DIAGNOSIS — F418 Other specified anxiety disorders: Secondary | ICD-10-CM

## 2021-03-15 MED ORDER — BUSPIRONE HCL 5 MG PO TABS: 5.0000 mg | ORAL_TABLET | Freq: Two times a day (BID) | ORAL | 1 refills | Status: DC | PRN

## 2021-03-16 ENCOUNTER — Other Ambulatory Visit (HOSPITAL_BASED_OUTPATIENT_CLINIC_OR_DEPARTMENT_OTHER): Payer: Self-pay

## 2021-03-16 ENCOUNTER — Other Ambulatory Visit: Payer: Self-pay

## 2021-03-16 DIAGNOSIS — F418 Other specified anxiety disorders: Secondary | ICD-10-CM

## 2021-03-16 MED ORDER — FLUOXETINE HCL 20 MG PO CAPS
ORAL_CAPSULE | ORAL | 0 refills | Status: DC
  Filled 2021-03-16: qty 90, 90d supply, fill #0

## 2021-03-16 MED ORDER — BUSPIRONE HCL 5 MG PO TABS
5.0000 mg | ORAL_TABLET | Freq: Two times a day (BID) | ORAL | 1 refills | Status: DC | PRN
  Filled 2021-03-16: qty 120, 30d supply, fill #0
  Filled 2021-06-11 – 2021-07-18 (×2): qty 120, 30d supply, fill #1

## 2021-06-11 ENCOUNTER — Other Ambulatory Visit: Payer: Self-pay | Admitting: Medical-Surgical

## 2021-06-11 DIAGNOSIS — F418 Other specified anxiety disorders: Secondary | ICD-10-CM

## 2021-06-14 ENCOUNTER — Other Ambulatory Visit: Payer: Self-pay

## 2021-06-15 ENCOUNTER — Other Ambulatory Visit: Payer: Self-pay

## 2021-06-15 MED ORDER — FLUOXETINE HCL 20 MG PO CAPS
ORAL_CAPSULE | ORAL | 0 refills | Status: DC
  Filled 2021-06-15 – 2021-07-18 (×2): qty 90, fill #0

## 2021-06-28 ENCOUNTER — Other Ambulatory Visit (HOSPITAL_COMMUNITY): Payer: Self-pay

## 2021-07-18 ENCOUNTER — Encounter: Payer: Self-pay | Admitting: Medical-Surgical

## 2021-07-19 ENCOUNTER — Other Ambulatory Visit (HOSPITAL_COMMUNITY): Payer: Self-pay

## 2021-07-19 ENCOUNTER — Other Ambulatory Visit: Payer: Self-pay

## 2021-07-19 DIAGNOSIS — F418 Other specified anxiety disorders: Secondary | ICD-10-CM

## 2021-07-19 MED ORDER — BUSPIRONE HCL 5 MG PO TABS
5.0000 mg | ORAL_TABLET | Freq: Two times a day (BID) | ORAL | 0 refills | Status: DC | PRN
  Filled 2021-07-19: qty 360, 90d supply, fill #0

## 2021-07-19 MED ORDER — FLUOXETINE HCL 20 MG PO CAPS
ORAL_CAPSULE | ORAL | 0 refills | Status: DC
  Filled 2021-07-19: qty 90, 90d supply, fill #0

## 2021-07-20 ENCOUNTER — Other Ambulatory Visit (HOSPITAL_COMMUNITY): Payer: Self-pay

## 2021-07-20 ENCOUNTER — Other Ambulatory Visit (HOSPITAL_BASED_OUTPATIENT_CLINIC_OR_DEPARTMENT_OTHER): Payer: Self-pay

## 2021-09-13 ENCOUNTER — Ambulatory Visit: Payer: No Typology Code available for payment source | Admitting: Physician Assistant

## 2021-10-18 ENCOUNTER — Other Ambulatory Visit: Payer: Self-pay | Admitting: Medical-Surgical

## 2021-10-18 DIAGNOSIS — F418 Other specified anxiety disorders: Secondary | ICD-10-CM

## 2021-10-20 ENCOUNTER — Other Ambulatory Visit (HOSPITAL_COMMUNITY): Payer: Self-pay

## 2021-10-20 MED ORDER — FLUOXETINE HCL 20 MG PO CAPS
ORAL_CAPSULE | ORAL | 0 refills | Status: DC
  Filled 2021-10-20: qty 30, 30d supply, fill #0

## 2021-11-19 ENCOUNTER — Encounter: Payer: Self-pay | Admitting: Medical-Surgical

## 2021-11-19 ENCOUNTER — Other Ambulatory Visit (HOSPITAL_BASED_OUTPATIENT_CLINIC_OR_DEPARTMENT_OTHER): Payer: Self-pay

## 2021-11-19 ENCOUNTER — Ambulatory Visit (INDEPENDENT_AMBULATORY_CARE_PROVIDER_SITE_OTHER): Payer: No Typology Code available for payment source | Admitting: Medical-Surgical

## 2021-11-19 VITALS — BP 112/70 | HR 95 | Resp 20 | Ht 65.0 in | Wt 97.6 lb

## 2021-11-19 DIAGNOSIS — Z01419 Encounter for gynecological examination (general) (routine) without abnormal findings: Secondary | ICD-10-CM | POA: Diagnosis not present

## 2021-11-19 DIAGNOSIS — F4010 Social phobia, unspecified: Secondary | ICD-10-CM

## 2021-11-19 DIAGNOSIS — F418 Other specified anxiety disorders: Secondary | ICD-10-CM

## 2021-11-19 DIAGNOSIS — Z681 Body mass index (BMI) 19 or less, adult: Secondary | ICD-10-CM | POA: Diagnosis not present

## 2021-11-19 MED ORDER — MIRTAZAPINE 15 MG PO TABS
ORAL_TABLET | ORAL | 0 refills | Status: DC
  Filled 2021-11-19: qty 90, 90d supply, fill #0

## 2021-11-19 MED ORDER — FLUOXETINE HCL 20 MG PO CAPS
ORAL_CAPSULE | ORAL | 0 refills | Status: DC
  Filled 2021-11-19: qty 30, 30d supply, fill #0

## 2021-11-19 MED ORDER — BUSPIRONE HCL 10 MG PO TABS
20.0000 mg | ORAL_TABLET | Freq: Every day | ORAL | 1 refills | Status: DC
  Filled 2021-11-19: qty 180, 90d supply, fill #0

## 2021-11-19 NOTE — Progress Notes (Signed)
Established Patient Office Visit  Subjective   Patient ID: Shirley Day, female   DOB: July 13, 2000 Age: 21 y.o. MRN: [id number]   Chief Complaint  Patient presents with   Weight Management Screening    HPI Pleasant 21 year old female presenting today for the following:  Mood: Taking fluoxetine 20 mg daily and BuSpar 20 mg once daily.  Tolerating both medications well without side effects.  Feels that the medications at these doses keep her mood very stable and she has no complaints.  Weight: Has had trouble gaining weight for years.  Previously at 120 pounds however she has been losing weight and is unable to find ways to regain.  She does not currently exercise.  Notes that she eats cereal or banana bread when she first gets up and then eats at work, usually a sausage biscuit.  When she gets home, she has a normal dinner that her mother makes.  Notes that she does have an altered sleep schedule and at times will not go to bed until 3-6 AM.  Most days she wakes up at 12-2 PM.  Denies any history of eating disorder.  Feels that she has a good relationship with food but she often forgets to eat.  She is unable to hold large portions of food but does eat which she can at each sitting.  She does not snack or graze regularly.  Denies abdominal pain, nausea, vomiting, binging/purging, constipation, or diarrhea.  Is interested in a medication that may help with weight gain since her efforts so far have not been fruitful.   Objective:    Vitals:   11/19/21 1620  BP: 112/70  Pulse: 95  Resp: 20  Height: 5\' 5"  (1.651 m)  Weight: 97 lb 9.6 oz (44.3 kg)  SpO2: 98%  BMI (Calculated): 16.24   Physical Exam Vitals and nursing note reviewed.  Constitutional:      General: She is not in acute distress.    Appearance: Normal appearance. She is underweight. She is not ill-appearing.  HENT:     Head: Normocephalic and atraumatic.  Cardiovascular:     Rate and Rhythm: Normal rate and regular  rhythm.     Pulses: Normal pulses.     Heart sounds: Normal heart sounds.  Pulmonary:     Effort: Pulmonary effort is normal. No respiratory distress.  Skin:    General: Skin is warm and dry.  Neurological:     Mental Status: She is alert and oriented to person, place, and time.  Psychiatric:        Mood and Affect: Mood normal.        Behavior: Behavior normal.        Thought Content: Thought content normal.        Judgment: Judgment normal.   No results found for this or any previous visit (from the past 24 hour(s)).     The ASCVD Risk score (Arnett DK, et al., 2019) failed to calculate for the following reasons:   The 2019 ASCVD risk score is only valid for ages 27 to 33   Assessment & Plan:   1. Anxiety with depression 2. Social anxiety disorder Stable on current regimen.  Continue fluoxetine 20 mg daily.  Continue BuSpar 20 mg once daily. - FLUoxetine (PROZAC) 20 MG capsule; TAKE 1 CAPSULE BY MOUTH EVERY DAY.  Dispense: 30 capsule; Refill: 0 - busPIRone (BUSPAR) 10 MG tablet; Take 2 tablets (20 mg total) by mouth daily.  Dispense: 180 tablet; Refill: 1  3. Well woman exam Referring to OB/GYN per patient request - Ambulatory referral to Obstetrics / Gynecology  4. Body mass index (BMI) less than 16.5 Discussed various options.  I would like her to get a food scale and start logging her food intake to adequately track calories and macros.  Would like to see her eating a high-protein, moderate fat, moderate carb diet of at least 1800 cal/day.  May benefit from breaking up meals into 5 small meals or 3 medium meals with 2 small snacks around the day.  Starting mirtazapine 7.5 mg nightly with increase to 15 mg nightly after 1 week.  Return in about 6 weeks (around 12/31/2021) for weight check.  ___________________________________________ Thayer Ohm, DNP, APRN, FNP-BC Primary Care and Sports Medicine [facility] [place]

## 2021-11-22 ENCOUNTER — Other Ambulatory Visit (HOSPITAL_BASED_OUTPATIENT_CLINIC_OR_DEPARTMENT_OTHER): Payer: Self-pay

## 2021-12-25 ENCOUNTER — Other Ambulatory Visit: Payer: Self-pay | Admitting: Medical-Surgical

## 2021-12-25 DIAGNOSIS — F418 Other specified anxiety disorders: Secondary | ICD-10-CM

## 2021-12-29 ENCOUNTER — Other Ambulatory Visit (HOSPITAL_COMMUNITY): Payer: Self-pay

## 2021-12-29 MED ORDER — FLUOXETINE HCL 20 MG PO CAPS
20.0000 mg | ORAL_CAPSULE | Freq: Every day | ORAL | 0 refills | Status: DC
  Filled 2021-12-29: qty 30, 30d supply, fill #0

## 2021-12-31 ENCOUNTER — Encounter: Payer: No Typology Code available for payment source | Admitting: Certified Nurse Midwife

## 2021-12-31 ENCOUNTER — Ambulatory Visit: Payer: No Typology Code available for payment source | Admitting: Medical-Surgical

## 2022-01-25 ENCOUNTER — Encounter: Payer: Self-pay | Admitting: Medical-Surgical

## 2022-01-26 ENCOUNTER — Encounter: Payer: Self-pay | Admitting: Medical-Surgical

## 2022-02-02 ENCOUNTER — Other Ambulatory Visit: Payer: Self-pay | Admitting: Medical-Surgical

## 2022-02-02 DIAGNOSIS — F418 Other specified anxiety disorders: Secondary | ICD-10-CM

## 2022-02-03 ENCOUNTER — Other Ambulatory Visit (HOSPITAL_COMMUNITY): Payer: Self-pay

## 2022-02-03 MED ORDER — FLUOXETINE HCL 20 MG PO CAPS: 20.0000 mg | ORAL_CAPSULE | Freq: Every day | ORAL | 1 refills | Status: DC

## 2022-02-03 MED ORDER — FLUOXETINE HCL 20 MG PO CAPS
20.0000 mg | ORAL_CAPSULE | Freq: Every day | ORAL | 0 refills | Status: DC
  Filled 2022-02-03: qty 15, 15d supply, fill #0

## 2022-02-03 NOTE — Telephone Encounter (Signed)
Needs an appointment for further refills.  Last office visit 11/19/2021  Last filled 12/29/2021  Sent 15 days to the pharmacy. No Refills

## 2022-02-03 NOTE — Addendum Note (Signed)
Addended byChristen Butter on: 02/03/2022 05:43 PM   Modules accepted: Orders

## 2022-02-04 ENCOUNTER — Other Ambulatory Visit (HOSPITAL_COMMUNITY): Payer: Self-pay

## 2022-02-04 NOTE — Telephone Encounter (Signed)
Patient has been scheduled for 03/07/22. AMUCK

## 2022-02-14 ENCOUNTER — Encounter: Payer: Self-pay | Admitting: Medical-Surgical

## 2022-03-06 NOTE — Progress Notes (Unsigned)
   Established Patient Office Visit  Subjective   Patient ID: Shirley Day, female   DOB: [birth year] Age: 21 y.o. MRN: [id number]   No chief complaint on file.   HPI Pleasant 21 year old female presenting today for follow up on mood. Has been taking Fluoxetine 20mg  daily along with Buspar 20mg  daily, tolerating well without side effects. About 3 months ago, she was started on Mirtazapine to help with weight gain since her BMI is underweight and she was not responding to dietary and behavioral modifications.    Objective:    There were no vitals filed for this visit.  Physical Exam   No results found for this or any previous visit (from the past 24 hour(s)).   {Labs (Optional):23779}  The ASCVD Risk score (Arnett DK, et al., 2019) failed to calculate for the following reasons:   The 2019 ASCVD risk score is only valid for ages 41 to 63   Assessment & Plan:   No problem-specific Assessment & Plan notes found for this encounter.   No follow-ups on file.  ___________________________________________ Thayer Ohm, DNP, APRN, FNP-BC Primary Care and Sports Medicine [facility]

## 2022-03-07 ENCOUNTER — Ambulatory Visit (INDEPENDENT_AMBULATORY_CARE_PROVIDER_SITE_OTHER): Payer: Self-pay | Admitting: Medical-Surgical

## 2022-03-07 DIAGNOSIS — Z91199 Patient's noncompliance with other medical treatment and regimen due to unspecified reason: Secondary | ICD-10-CM

## 2022-03-07 DIAGNOSIS — F418 Other specified anxiety disorders: Secondary | ICD-10-CM

## 2022-03-07 DIAGNOSIS — Z23 Encounter for immunization: Secondary | ICD-10-CM

## 2022-03-07 DIAGNOSIS — F4010 Social phobia, unspecified: Secondary | ICD-10-CM

## 2022-03-24 ENCOUNTER — Telehealth (INDEPENDENT_AMBULATORY_CARE_PROVIDER_SITE_OTHER): Payer: No Typology Code available for payment source | Admitting: Medical-Surgical

## 2022-03-24 ENCOUNTER — Encounter: Payer: Self-pay | Admitting: Medical-Surgical

## 2022-03-24 ENCOUNTER — Other Ambulatory Visit (HOSPITAL_COMMUNITY): Payer: Self-pay

## 2022-03-24 DIAGNOSIS — F418 Other specified anxiety disorders: Secondary | ICD-10-CM

## 2022-03-24 DIAGNOSIS — F4010 Social phobia, unspecified: Secondary | ICD-10-CM | POA: Diagnosis not present

## 2022-03-24 MED ORDER — MIRTAZAPINE 15 MG PO TABS
15.0000 mg | ORAL_TABLET | Freq: Every day | ORAL | 1 refills | Status: DC
  Filled 2022-03-24: qty 180, 90d supply, fill #0

## 2022-03-24 MED ORDER — FLUOXETINE HCL 20 MG PO CAPS
20.0000 mg | ORAL_CAPSULE | Freq: Every day | ORAL | 1 refills | Status: DC
  Filled 2022-03-24: qty 90, 90d supply, fill #0

## 2022-03-24 MED ORDER — BUSPIRONE HCL 10 MG PO TABS
20.0000 mg | ORAL_TABLET | Freq: Every day | ORAL | 1 refills | Status: DC
  Filled 2022-03-24: qty 180, 90d supply, fill #0

## 2022-03-24 NOTE — Progress Notes (Signed)
Virtual Visit via Video Note  I connected with Shirley Day on 03/24/22 at  1:00 PM EDT by a video enabled telemedicine application and verified that I am speaking with the correct person using two identifiers.   I discussed the limitations of evaluation and management by telemedicine and the availability of in person appointments. The patient expressed understanding and agreed to proceed.  Patient location: home Provider locations: office  Subjective:    CC: Mood follow-up  HPI: Pleasant 21 year old female presenting today via [place] video visit for mood follow-up.  She has been taking fluoxetine 20 mg daily along with BuSpar 20 mg daily to manage her social anxiety and depressive symptoms.  Notes that the medications are working well for her and keeping her symptoms very stable.  She is also taking mirtazapine 15 mg nightly, tolerating well without excessive grogginess or other side effects.  Notes that the medication does help her sleep however she does have some nights when she just cannot seem to get to a good point and fall asleep easily.  She notes that she also has intermittent nightmares but this often happens when she has forgotten to take her mirtazapine dose at night.  Denies SI/HI.  Past medical history, Surgical history, Family history not pertinant except as noted below, Social history, Allergies, and medications have been entered into the medical record, reviewed, and corrections made.   Review of Systems: See HPI for pertinent positives and negatives.   Objective:    General: Speaking clearly in complete sentences without any shortness of breath.  Alert and oriented x3.  Normal judgment. No apparent acute distress.  Impression and Recommendations:    1. Anxiety with depression 2. Social anxiety disorder Symptoms stable.  Continue fluoxetine 20 mg and BuSpar 20 mg daily as prescribed.  Since she does have occasional issues getting to sleep even with mirtazapine at the  15 mg dose, we will increase it to mirtazapine 15-30 mg nightly for sleep.  Advised to monitor weight due to the risk of weight gain with this medication. - busPIRone (BUSPAR) 10 MG tablet; Take 2 tablets (20 mg total) by mouth daily.  Dispense: 180 tablet; Refill: 1 - FLUoxetine (PROZAC) 20 MG capsule; Take 1 capsule by mouth daily. Needs appointment.  Dispense: 90 capsule; Refill: 1  I discussed the assessment and treatment plan with the patient. The patient was provided an opportunity to ask questions and all were answered. The patient agreed with the plan and demonstrated an understanding of the instructions.   The patient was advised to call back or seek an in-person evaluation if the symptoms worsen or if the condition fails to improve as anticipated.  25 minutes of non-face-to-face time was provided during this encounter.  Return in about 6 months (around 09/23/2022) for mood follow up.  Shirley Sorrel, DNP, APRN, FNP-BC [facility]

## 2022-03-25 ENCOUNTER — Other Ambulatory Visit (HOSPITAL_COMMUNITY): Payer: Self-pay

## 2022-05-26 ENCOUNTER — Ambulatory Visit: Payer: No Typology Code available for payment source | Admitting: Medical-Surgical

## 2022-06-24 ENCOUNTER — Ambulatory Visit (INDEPENDENT_AMBULATORY_CARE_PROVIDER_SITE_OTHER): Payer: 59 | Admitting: Medical-Surgical

## 2022-06-24 ENCOUNTER — Encounter: Payer: Self-pay | Admitting: Medical-Surgical

## 2022-06-24 VITALS — BP 89/61 | HR 86 | Resp 20 | Ht 65.0 in | Wt 116.0 lb

## 2022-06-24 DIAGNOSIS — K011 Impacted teeth: Secondary | ICD-10-CM

## 2022-06-24 DIAGNOSIS — R197 Diarrhea, unspecified: Secondary | ICD-10-CM

## 2022-06-24 MED ORDER — METRONIDAZOLE 500 MG PO TABS: 500.0000 mg | ORAL_TABLET | Freq: Two times a day (BID) | ORAL | 0 refills | Status: AC

## 2022-06-24 MED ORDER — CIPROFLOXACIN HCL 500 MG PO TABS: 500.0000 mg | ORAL_TABLET | Freq: Two times a day (BID) | ORAL | 0 refills | Status: AC

## 2022-06-24 NOTE — Progress Notes (Signed)
Established Patient Office Visit  Subjective   Patient ID: Shirley Day, female   DOB: 2001/03/21 Age: 22 y.o. MRN: [id number]   Chief Complaint  Patient presents with   Diarrhea   HPI Pleasant 22 year old female presenting today for for evaluation of diarrhea.  Notes that her symptoms started the beginning of December and she has been having watery diarrhea since then.  Every time she eats, she has a bowel movement.  Reports that the color is brownish-green and that it has no unusual odor.  She has seen no blood or black tarry looking stools.  Occasionally has stomach cramps that are resolved after having a bowel movement.  No recent sick contacts, antibiotics, or hospitalizations.  No travel.  Denies fever, chills, nausea, vomiting.  Has stopped taking all of her medications and wonders if that might have something to do with it since she was taking mirtazapine for weight gain.  Has not tried any home remedies or over-the-counter options.  Able to eat and drink without difficulty.  Requesting a referral to oral surgery.  Notes her wisdom teeth are all impacted and quite uncomfortable.  Her dentist referred her however the person who was sent to is not in her network.   Objective:    Vitals:   06/24/22 1454  BP: (!) 89/61  Pulse: 86  Resp: 20  Height: 5\' 5"  (1.651 m)  Weight: 116 lb (52.6 kg)  SpO2: 97%  BMI (Calculated): 19.3    Physical Exam Vitals reviewed.  Constitutional:      General: She is not in acute distress.    Appearance: Normal appearance. She is not ill-appearing.  HENT:     Head: Normocephalic and atraumatic.  Cardiovascular:     Rate and Rhythm: Normal rate and regular rhythm.     Pulses: Normal pulses.     Heart sounds: Normal heart sounds.  Pulmonary:     Effort: Pulmonary effort is normal. No respiratory distress.     Breath sounds: Normal breath sounds. No wheezing, rhonchi or rales.  Abdominal:     General: Abdomen is flat. Bowel sounds are  normal. There is no distension.     Palpations: There is no mass.     Tenderness: There is abdominal tenderness (Mild diffuse). There is no guarding or rebound.     Hernia: No hernia is present.  Skin:    General: Skin is warm and dry.  Neurological:     Mental Status: She is alert and oriented to person, place, and time.  Psychiatric:        Mood and Affect: Mood normal.        Behavior: Behavior normal.        Thought Content: Thought content normal.        Judgment: Judgment normal.   No results found for this or any previous visit (from the past 24 hour(s)).     The ASCVD Risk score (Arnett DK, et al., 2019) failed to calculate for the following reasons:   The 2019 ASCVD risk score is only valid for ages 35 to 79   Assessment & Plan:   1. Diarrhea of presumed infectious origin Unclear etiology.  With the stomach viruses going around the community, suspect this may have been an infectious issue.  We will go ahead and treat with Cipro and Flagyl for colitis.  If no improvement in symptoms with this therapy, we will run stool studies for further evaluation.  Offered Bentyl to help with post meal  spasms however she declined today. - ciprofloxacin (CIPRO) 500 MG tablet; Take 1 tablet (500 mg total) by mouth 2 (two) times daily for 10 days.  Dispense: 14 tablet; Refill: 0 - metroNIDAZOLE (FLAGYL) 500 MG tablet; Take 1 tablet (500 mg total) by mouth 2 (two) times daily for 7 days.  Dispense: 14 tablet; Refill: 0  2. Impacted tooth Referring to oral surgery. - Ambulatory referral to Oral Maxillofacial Surgery  Return if symptoms worsen or fail to improve.  ___________________________________________ Clearnce Sorrel, DNP, APRN, FNP-BC [facility]

## 2023-01-21 IMAGING — DX DG HAND COMPLETE 3+V*R*
3 series · 3 of 3 positions shown · non-contrast
Comparison: 12/21/2011

CLINICAL DATA: Dog bite.  Laceration to the palm of the hand.

EXAM:
RIGHT HAND - COMPLETE 3+ VIEW

[hand pa]
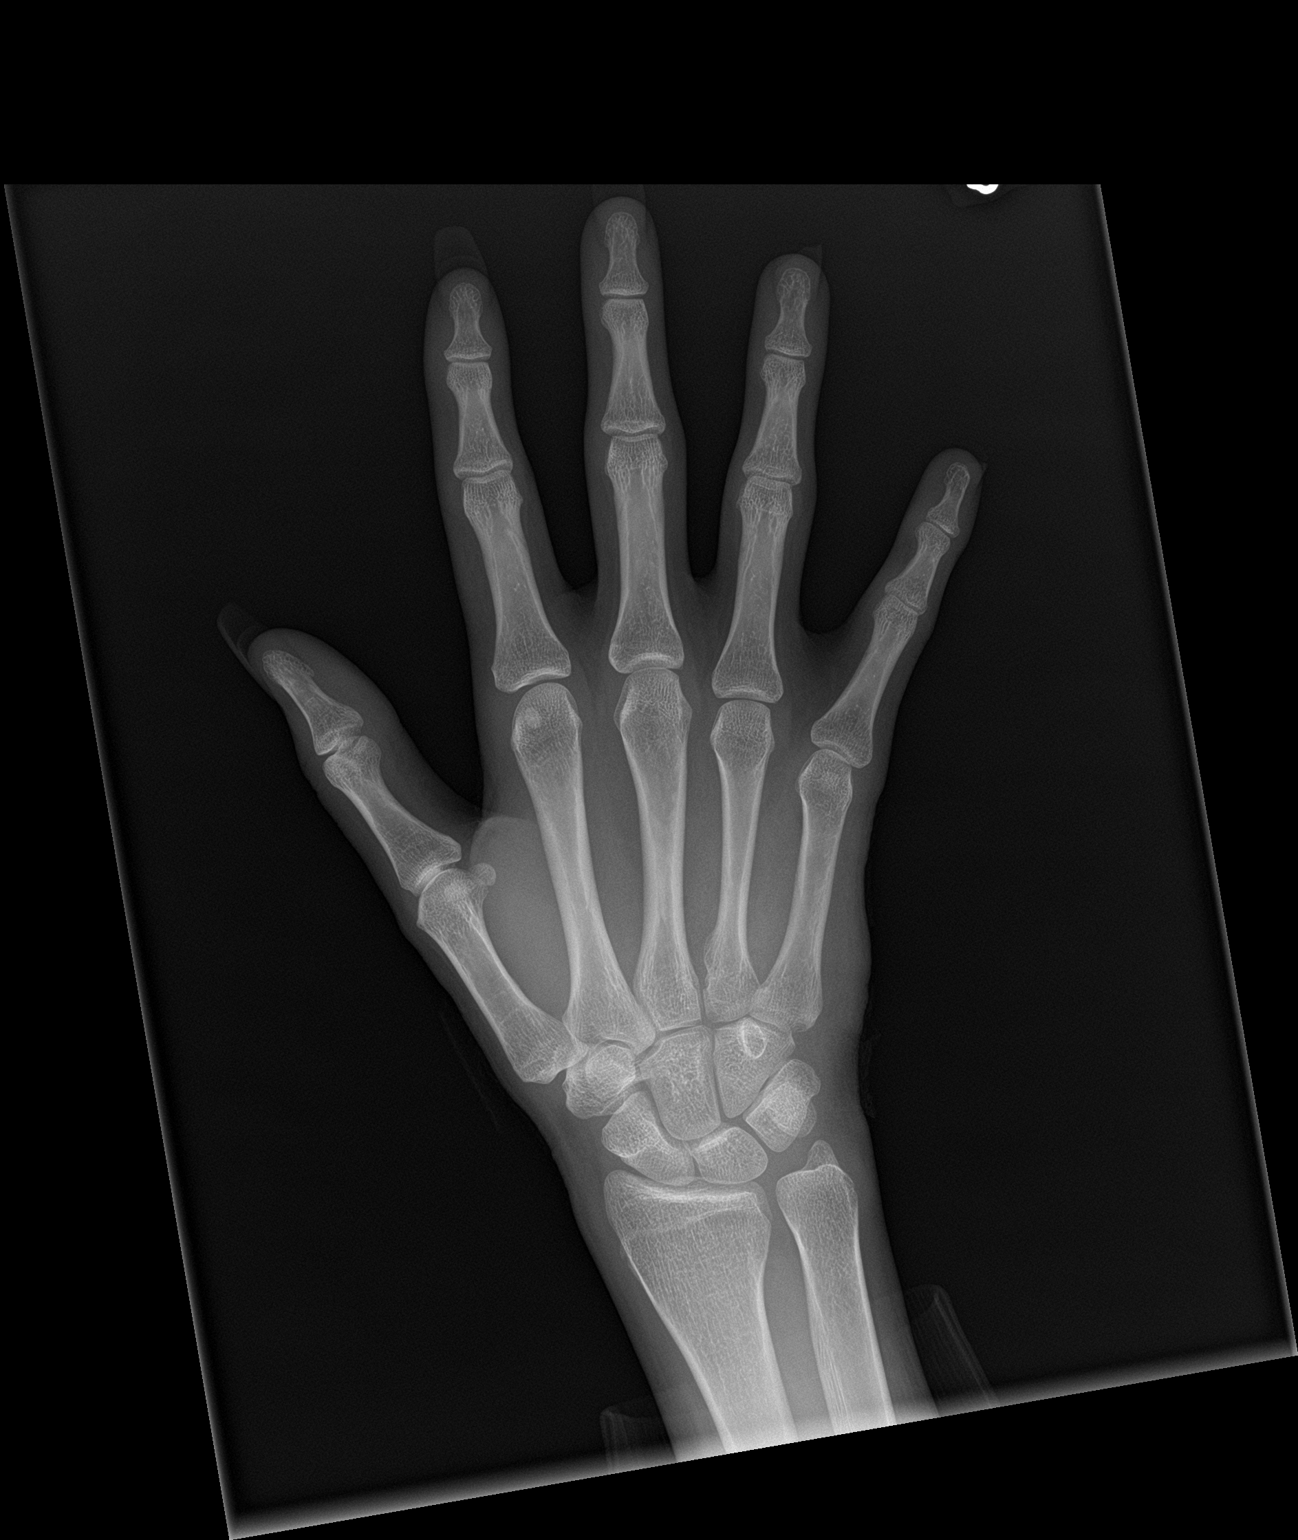

[hand obl]
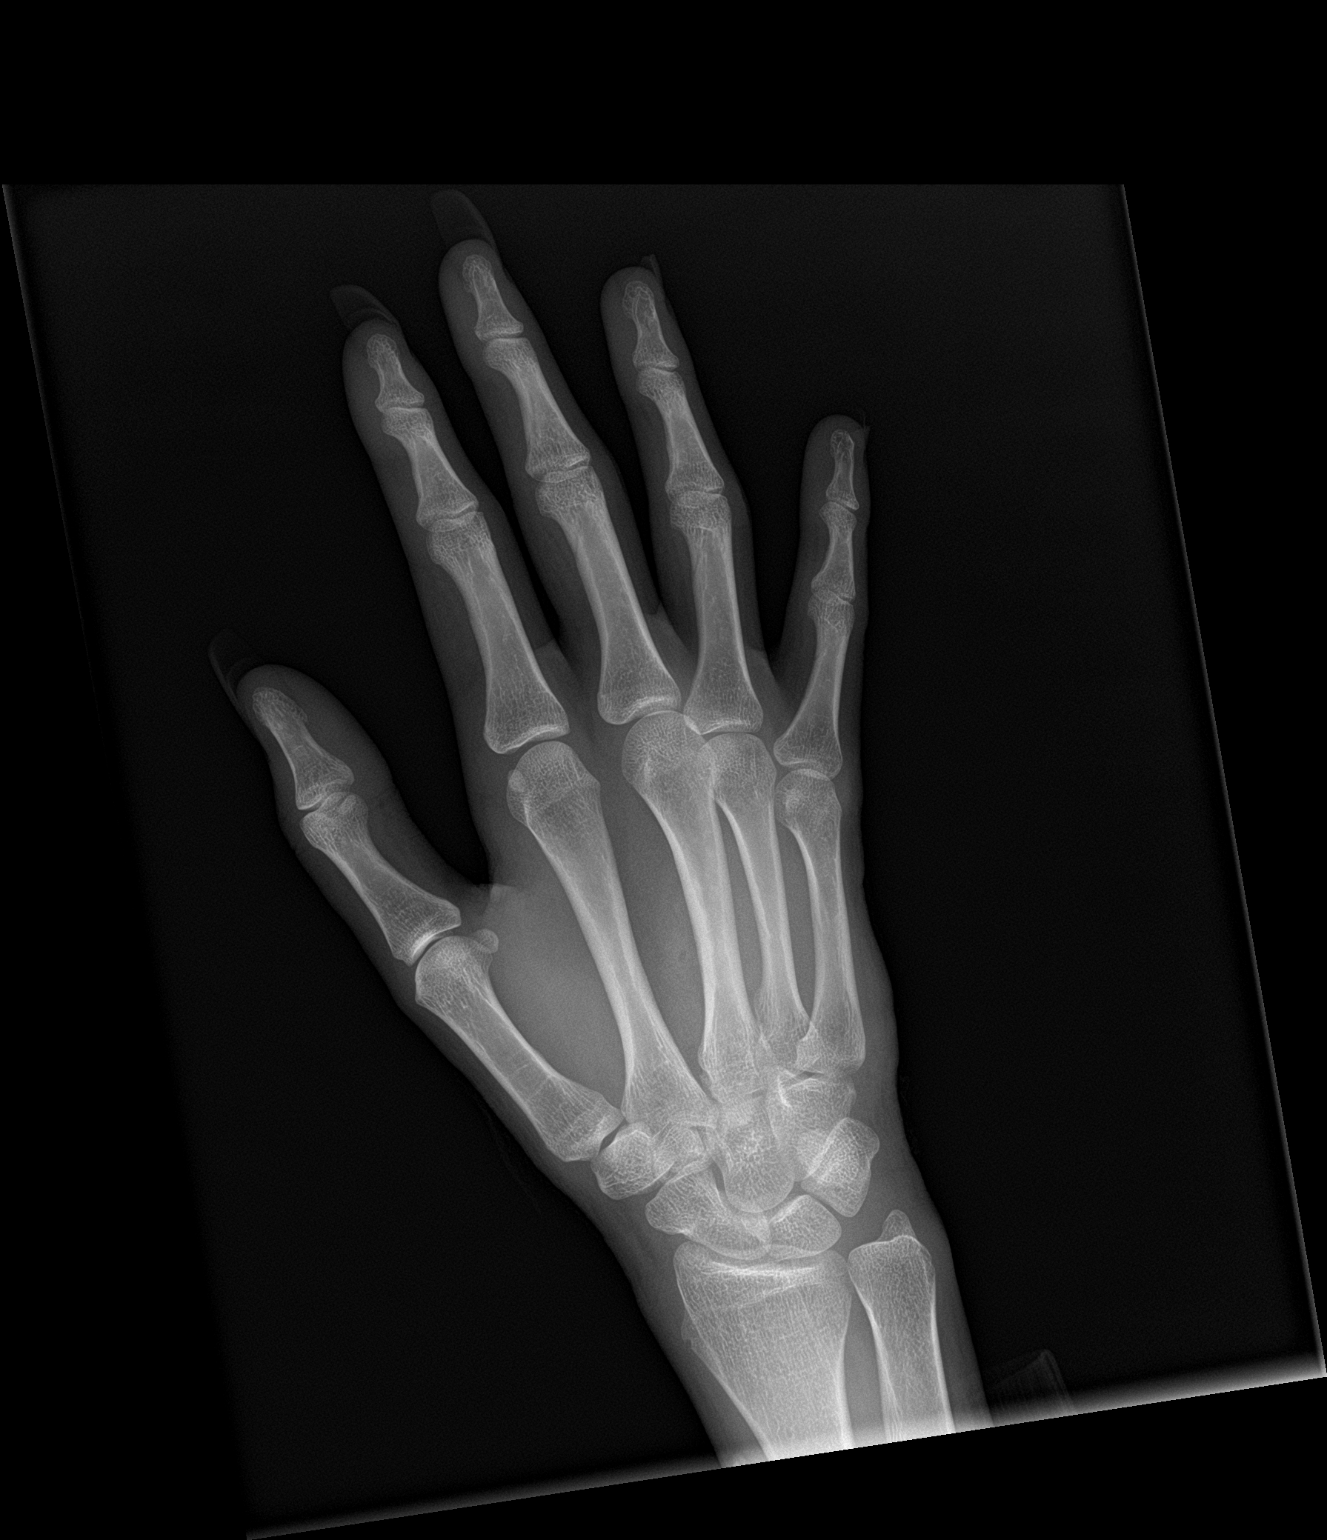

[hand lat]
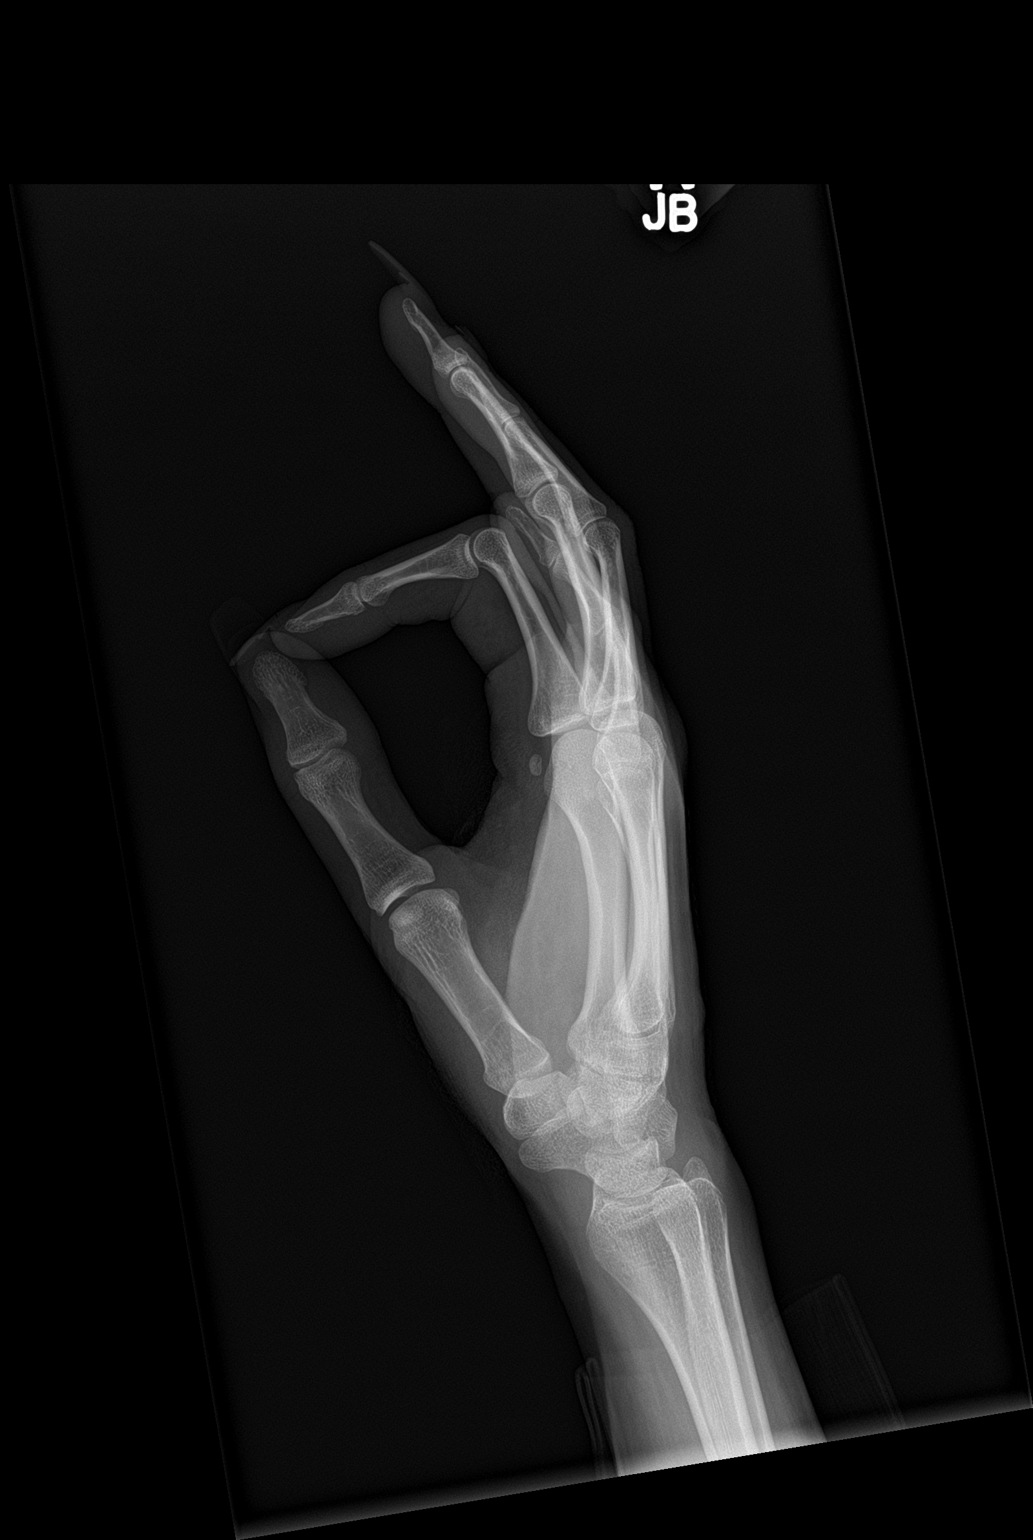

[3 of 3 positions shown; findings below may reference images not displayed]

FINDINGS: No fracture or bone lesion.

Joints normally spaced and aligned.

Soft tissues are unremarkable.  No radiopaque foreign body.
IMPRESSION: Negative.

## 2023-01-25 ENCOUNTER — Encounter: Payer: Self-pay | Admitting: Medical-Surgical

## 2023-05-01 ENCOUNTER — Ambulatory Visit
Admission: EM | Admit: 2023-05-01 | Discharge: 2023-05-01 | Disposition: A | Discharge status: Home / Self Care | Payer: 59 | Attending: Family Medicine | Admitting: Family Medicine

## 2023-05-01 ENCOUNTER — Other Ambulatory Visit: Payer: Self-pay

## 2023-05-01 DIAGNOSIS — R1032 Left lower quadrant pain: Secondary | ICD-10-CM | POA: Diagnosis not present

## 2023-05-01 DIAGNOSIS — N938 Other specified abnormal uterine and vaginal bleeding: Secondary | ICD-10-CM | POA: Diagnosis not present

## 2023-05-01 MED ORDER — NAPROXEN SODIUM 550 MG PO TABS: 550.0000 mg | ORAL_TABLET | Freq: Two times a day (BID) | ORAL | 0 refills | Status: DC

## 2023-05-01 NOTE — ED Provider Notes (Signed)
[facility]    CSN: [id number] Arrival date & time: 05/01/23  1724      History   Chief Complaint Chief Complaint  Patient presents with   Vaginal Bleeding    HPI Shirley Day is a 22 y.o. female.   HPI  Patient states that she took the " O pill" available over-the-counter for birth control for about 2 months.  She states she had regular menses prior to this.  While taking the old pill she had irregular menstrual periods.  She currently is off of this medication but still having irregular bleeding.  She went off of it about a month and a half ago.  She is here today for left lower quadrant abdominal pain.  She has no leading at this time.  No vaginal discharge.  No nausea or vomiting.  No diarrhea  Past Medical History:  Diagnosis Date   Asthma     Patient Active Problem List   Diagnosis Date Noted   PVC's (premature ventricular contractions) 02/08/2021   Social anxiety disorder 01/06/2021   Anxiety with depression 01/06/2021    Past Surgical History:  Procedure Laterality Date   NO PAST SURGERIES      OB History   No obstetric history on file.      Home Medications    Prior to Admission medications   Medication Sig Start Date End Date Taking? Authorizing Provider  naproxen sodium (ANAPROX DS) 550 MG tablet Take 1 tablet (550 mg total) by mouth 2 (two) times daily with a meal. 05/01/23  Yes Eustace Moore, MD    Family History Family History  Problem Relation Age of Onset   Hypertension Mother    Healthy Father    Skin cancer Other    Heart attack Other     Social History Social History   Tobacco Use   Smoking status: Former    Types: E-cigarettes    Quit date: 07/27/2020    Years since quitting: 2.7   Smokeless tobacco: Never  Vaping Use   Vaping status: Former  Substance Use Topics   Alcohol use: No   Drug use: Never     Allergies   Patient has no known allergies.   Review of Systems Review of Systems  See  HPI Physical Exam Triage Vital Signs ED Triage Vitals  Encounter Vitals Group     BP 05/01/23 1738 105/72     Systolic BP Percentile --      Diastolic BP Percentile --      Pulse Rate 05/01/23 1738 85     Resp 05/01/23 1738 16     Temp 05/01/23 1738 98.5 F (36.9 C)     Temp src --      SpO2 05/01/23 1738 99 %     Weight --      Height --      Head Circumference --      Peak Flow --      Pain Score 05/01/23 1742 6     Pain Loc --      Pain Education --      Exclude from Growth Chart --    No data found.  Updated Vital Signs BP 105/72   Pulse 85   Temp 98.5 F (36.9 C)   Resp 16   LMP 04/23/2023   SpO2 99%      Physical Exam Constitutional:      General: She is not in acute distress.    Appearance: She is  well-developed and normal weight. She is not ill-appearing.  HENT:     Head: Normocephalic and atraumatic.  Eyes:     Conjunctiva/sclera: Conjunctivae normal.     Pupils: Pupils are equal, round, and reactive to light.  Cardiovascular:     Rate and Rhythm: Normal rate.  Pulmonary:     Effort: Pulmonary effort is normal. No respiratory distress.  Abdominal:     General: Abdomen is flat and scaphoid. Bowel sounds are normal. There is no distension.     Palpations: Abdomen is soft. There is mass. There is no hepatomegaly or splenomegaly.     Tenderness: There is abdominal tenderness in the left lower quadrant.    Musculoskeletal:        General: Normal range of motion.     Cervical back: Normal range of motion.  Skin:    General: Skin is warm and dry.  Neurological:     Mental Status: She is alert.      UC Treatments / Results  Labs (all labs ordered are listed, but only abnormal results are displayed) Labs Reviewed - No data to display  EKG   Radiology No results found.  Procedures Procedures (including critical care time)  Medications Ordered in UC Medications - No data to display  Initial Impression / Assessment and Plan / UC Course   I have reviewed the triage vital signs and the nursing notes.  Pertinent labs & imaging results that were available during my care of the patient were reviewed by me and considered in my medical decision making (see chart for details).     Discussed possible ovarian cyst.  By history it does not seem to be constipation.  We are unable to do scanning after hours.  Discussed returning if the pain worsens Final Clinical Impressions(s) / UC Diagnoses   Final diagnoses:  DUB (dysfunctional uterine bleeding)  Abdominal pain, LLQ     Discharge Instructions      Take naproxen 2 times a day with food Try warm to the area for 20 minutes every couple of hours   ED Prescriptions     Medication Sig Dispense Auth. Provider   naproxen sodium (ANAPROX DS) 550 MG tablet Take 1 tablet (550 mg total) by mouth 2 (two) times daily with a meal. 20 tablet Eustace Moore, MD      PDMP not reviewed this encounter.   Eustace Moore, MD 05/01/23 1901

## 2023-05-01 NOTE — Discharge Instructions (Signed)
Take naproxen 2 times a day with food Try warm to the area for 20 minutes every couple of hours

## 2023-05-01 NOTE — ED Triage Notes (Addendum)
C/o lower abdominal pain and has had intermittent vaginal bleeding for past month and half. Lmp 11/10. Sts was taking birth control up until about a month ago.

## 2023-05-05 ENCOUNTER — Ambulatory Visit: Payer: 59 | Admitting: Medical-Surgical

## 2023-05-22 ENCOUNTER — Telehealth: Payer: 59 | Admitting: Medical-Surgical

## 2023-05-23 ENCOUNTER — Telehealth (INDEPENDENT_AMBULATORY_CARE_PROVIDER_SITE_OTHER): Payer: 59 | Admitting: Family Medicine

## 2023-05-23 ENCOUNTER — Encounter: Payer: Self-pay | Admitting: Family Medicine

## 2023-05-23 DIAGNOSIS — F418 Other specified anxiety disorders: Secondary | ICD-10-CM

## 2023-05-23 MED ORDER — BUSPIRONE HCL 10 MG PO TABS: 20.0000 mg | ORAL_TABLET | Freq: Two times a day (BID) | ORAL | 0 refills | Status: DC

## 2023-05-23 MED ORDER — FLUOXETINE HCL 10 MG PO CAPS: ORAL_CAPSULE | ORAL | 0 refills | Status: DC

## 2023-05-23 NOTE — Assessment & Plan Note (Signed)
Options we will go ahead and restart medication will need to taper back up on the fluoxetine and start with 10 mg and then go up to 20 mg.  Did encourage her to follow-up with PCP in about 6 weeks after starting the medication to make sure that they are on track and make any adjustments needed.  She would also like a refill on the buspirone she says she still been taking that 1 on and off but the prescription had expired.  We discussed the option of therapy/counseling she says she has done some trauma therapy in the past she will think about it and let us know.

## 2023-05-23 NOTE — Progress Notes (Signed)
Virtual Visit via Video Note  I connected with Shirley Day on 05/23/23 at  9:10 AM EST by a video enabled telemedicine application and verified that I am speaking with the correct person using two identifiers.   I discussed the limitations of evaluation and management by telemedicine and the availability of in person appointments. The patient expressed understanding and agreed to proceed.  Patient location: at home Provider location: in office  Subjective:    CC:  No chief complaint on file.   HPI: She says she would like to get back on her antidepressants which she was taking about a year ago.  At that time she came off because she was with a boyfriend who did not want her to be on the medications but she tolerated them well and felt like they were helpful.  She has had some increased stressors recently.  She reports feeling down and depressed as well as some anxiety symptoms.  She feels like her sleep quality is really off a lot of times she has difficulty falling asleep and then when she falls asleep she feels like she sleeps excessively.  She has noted some irritability.  Thoughts of not wanting to be here but no active thoughts of wanting to harm herself.   Flowsheet Row Video Visit from 05/23/2023 in [facility] at [facility]  PHQ-9 Total Score 12         05/23/2023    9:31 AM 03/24/2022    4:51 PM 11/19/2021    4:48 PM 02/03/2021    9:27 AM  GAD 7 : Generalized Anxiety Score  Nervous, Anxious, on Edge 3 1 1 1   Control/stop worrying 1 1 0 1  Worry too much - different things 1 1 0 1  Trouble relaxing 3 1 1 1   Restless 3 0 0 0  Easily annoyed or irritable 3 1 1 1   Afraid - awful might happen 1 0 0 1  Total GAD 7 Score 15 5 3 6   Anxiety Difficulty Very difficult Somewhat difficult Not difficult at all Somewhat difficult      Past medical history, Surgical history, Family history not pertinant except as noted below,  Social history, Allergies, and medications have been entered into the medical record, reviewed, and corrections made.    Objective:    General: Speaking clearly in complete sentences without any shortness of breath.  Alert and oriented x3.  Normal judgment. No apparent acute distress.    Impression and Recommendations:    Problem List Items Addressed This Visit       Other   Anxiety with depression - Primary    Options we will go ahead and restart medication will need to taper back up on the fluoxetine and start with 10 mg and then go up to 20 mg.  Did encourage her to follow-up with PCP in about 6 weeks after starting the medication to make sure that they are on track and make any adjustments needed.  She would also like a refill on the buspirone she says she still been taking that 1 on and off but the prescription had expired.  We discussed the option of therapy/counseling she says she has done some trauma therapy in the past she will think about it and let us know.      Relevant Medications   busPIRone (BUSPAR) 10 MG tablet   FLUoxetine (PROZAC) 10 MG capsule    No orders of the defined types  were placed in this encounter.   She declines getting flu vaccine this year.  Did encourage her to schedule for her Pap smear she is also been having some lower pelvic pain which she thinks is an ovarian cyst and so encouraged her to schedule with her PCP for further evaluation and workup especially since she still been having pain.  She did go to urgent care for the symptoms about 4 weeks ago.  Meds ordered this encounter  Medications   busPIRone (BUSPAR) 10 MG tablet    Sig: Take 2 tablets (20 mg total) by mouth 2 (two) times daily.    Dispense:  180 tablet    Refill:  0   FLUoxetine (PROZAC) 10 MG capsule    Sig: Take 1 capsule (10 mg total) by mouth daily for 8 days, THEN 2 capsules (20 mg total) daily for 22 days.    Dispense:  60 capsule    Refill:  0     I discussed the  assessment and treatment plan with the patient. The patient was provided an opportunity to ask questions and all were answered. The patient agreed with the plan and demonstrated an understanding of the instructions.   The patient was advised to call back or seek an in-person evaluation if the symptoms worsen or if the condition fails to improve as anticipated.   Nani Gasser, MD

## 2023-05-23 NOTE — Progress Notes (Signed)
Called pt and lvm advising that I was calling to do her prescreening before her visit

## 2023-06-01 ENCOUNTER — Encounter: Payer: Self-pay | Admitting: Family Medicine

## 2023-06-01 DIAGNOSIS — F418 Other specified anxiety disorders: Secondary | ICD-10-CM

## 2023-06-08 NOTE — Telephone Encounter (Signed)
Referral faxed to   [facility]  [phone] [address] [place] [id number]    Pt sent a mychart letter

## 2023-06-13 ENCOUNTER — Encounter: Payer: 59 | Admitting: Obstetrics and Gynecology

## 2023-07-02 ENCOUNTER — Other Ambulatory Visit: Payer: Self-pay | Admitting: Family Medicine

## 2023-07-02 DIAGNOSIS — F418 Other specified anxiety disorders: Secondary | ICD-10-CM

## 2023-07-04 ENCOUNTER — Encounter: Payer: 59 | Admitting: Obstetrics and Gynecology

## 2023-07-04 ENCOUNTER — Other Ambulatory Visit: Payer: Self-pay

## 2023-07-04 MED ORDER — BUSPIRONE HCL 10 MG PO TABS
20.0000 mg | ORAL_TABLET | Freq: Two times a day (BID) | ORAL | 0 refills | Status: DC
  Filled 2023-07-04: qty 120, 30d supply, fill #0

## 2023-07-04 MED ORDER — FLUOXETINE HCL 10 MG PO CAPS
20.0000 mg | ORAL_CAPSULE | Freq: Every day | ORAL | 0 refills | Status: DC
  Filled 2023-07-04: qty 60, 30d supply, fill #0

## 2023-10-25 ENCOUNTER — Ambulatory Visit (INDEPENDENT_AMBULATORY_CARE_PROVIDER_SITE_OTHER): Admitting: Medical-Surgical

## 2023-10-25 ENCOUNTER — Encounter: Payer: Self-pay | Admitting: Medical-Surgical

## 2023-10-25 ENCOUNTER — Other Ambulatory Visit (HOSPITAL_COMMUNITY)
Admission: RE | Admit: 2023-10-25 | Discharge: 2023-10-25 | Disposition: A | Discharge status: Home / Self Care | Source: Ambulatory Visit | Attending: Medical-Surgical | Admitting: Medical-Surgical

## 2023-10-25 VITALS — BP 107/68 | HR 106 | Resp 20 | Ht 65.0 in | Wt 101.9 lb

## 2023-10-25 DIAGNOSIS — Z124 Encounter for screening for malignant neoplasm of cervix: Secondary | ICD-10-CM | POA: Diagnosis not present

## 2023-10-25 DIAGNOSIS — R102 Pelvic and perineal pain unspecified side: Secondary | ICD-10-CM

## 2023-10-25 DIAGNOSIS — F418 Other specified anxiety disorders: Secondary | ICD-10-CM

## 2023-10-25 DIAGNOSIS — Z118 Encounter for screening for other infectious and parasitic diseases: Secondary | ICD-10-CM | POA: Diagnosis present

## 2023-10-25 NOTE — Progress Notes (Signed)
        Established patient visit  History, exam, impression, and plan:  1. Pelvic pain Shirley Day 23 year old female presenting today with reports of pelvic pain.  She was seen at urgent care approximately 6 months ago with similar symptoms including a sharp stabbing pain that is intermittent but happens off and on throughout the day on most days.  Per the urgent care note in November, her pain was located on the left lower quadrant however today she reports it is on the right lower quadrant.  Denies any fever, chills, vaginal discharge, irregular bleeding, vaginal irritation, odor, urinary symptoms, or bowel dysfunction.  Having regular bowel movements with no change in stool appearance.  No history of kidney stones.  She does still have her appendix.  No nausea or vomiting associated with her pain.  She is sexually active however has not had intercourse in the last month.  She and her partner have been together for a year and a committed relationship.  Admits that they do not use condoms with every encounter.  No concern for STIs today.  Last menstrual period started 10/20/2023, normal flow and duration.  On abdominal exam, abdomen is flat, soft, nondistended.  Bowel sounds positive x 4 quadrants.  Very mild tenderness to the epigastric region.  Left lower quadrant is tender to palpation.  Right lower quadrant tender but more laterally towards the pelvic girdle.  Bimanual exam completed today with continued left lower quadrant tenderness.  Unclear etiology.  Reports that her urgent care visit in November did not include any imaging or further workup and it was suspected that she had an ovarian cyst.  Thinks this may be a repeat ovarian cyst and would like further evaluation.  Plan for pelvic ultrasound with transvaginal imaging if needed.  Wet prep for BV and yeast today. - US  Pelvic Complete With Transvaginal; Future - WET PREP FOR TRICH, YEAST, CLUE  2. Screening for chlamydial disease (Primary) She is  due for screening for chlamydia so adding that to her Pap smear sample today.  3. Cervical cancer screening Has never had a Pap smear and is due for cervical cancer screening.  Pap smear completed today without difficulty.  Adding gonorrhea and chlamydia evaluation to the Pap smear sample as further evaluation of her chronic pelvic pain. - Cytology - PAP  4. Anxiety with depression Doing well on fluoxetine  20 mg daily and BuSpar  20 mg twice daily.  Tolerating both medications well without side effects and feels that they work very well to keep her symptoms controlled.  Requesting refills today.  Mood, thought pattern, affect, cognition, and speech all normal today.  Denies SI/HI.  Continue fluoxetine  and BuSpar  as prescribed.  Procedures performed this visit: None.  Return in 6 months (on 04/26/2024) for mood follow up.  __________________________________ Maryl Snook, DNP, APRN, FNP-BC [facility]

## 2023-10-26 ENCOUNTER — Ambulatory Visit: Payer: Self-pay | Admitting: Medical-Surgical

## 2023-10-26 DIAGNOSIS — R87613 High grade squamous intraepithelial lesion on cytologic smear of cervix (HGSIL): Secondary | ICD-10-CM

## 2023-10-26 LAB — WET PREP FOR TRICH, YEAST, CLUE
Clue Cell Exam: NEGATIVE
Trichomonas Exam: NEGATIVE
Yeast Exam: NEGATIVE

## 2023-10-26 LAB — SPECIMEN STATUS REPORT

## 2023-10-27 ENCOUNTER — Encounter: Payer: Self-pay | Admitting: Medical-Surgical

## 2023-10-27 ENCOUNTER — Other Ambulatory Visit

## 2023-10-27 LAB — CYTOLOGY - PAP
Chlamydia: NEGATIVE
Comment: NEGATIVE
Comment: NORMAL
Diagnosis: HIGH — AB
Neisseria Gonorrhea: NEGATIVE

## 2023-10-30 ENCOUNTER — Encounter: Payer: Self-pay | Admitting: Medical-Surgical

## 2023-10-30 ENCOUNTER — Ambulatory Visit (INDEPENDENT_AMBULATORY_CARE_PROVIDER_SITE_OTHER)

## 2023-10-30 DIAGNOSIS — R102 Pelvic and perineal pain: Secondary | ICD-10-CM | POA: Diagnosis not present

## 2023-11-02 ENCOUNTER — Other Ambulatory Visit (HOSPITAL_COMMUNITY): Payer: Self-pay

## 2023-11-02 ENCOUNTER — Other Ambulatory Visit: Payer: Self-pay | Admitting: Medical-Surgical

## 2023-11-02 DIAGNOSIS — F418 Other specified anxiety disorders: Secondary | ICD-10-CM

## 2023-11-02 MED ORDER — BUSPIRONE HCL 10 MG PO TABS
20.0000 mg | ORAL_TABLET | Freq: Two times a day (BID) | ORAL | 0 refills | Status: DC
  Filled 2023-11-02 – 2024-01-07 (×2): qty 120, 30d supply, fill #0

## 2023-11-02 MED ORDER — FLUOXETINE HCL 10 MG PO CAPS
20.0000 mg | ORAL_CAPSULE | Freq: Every day | ORAL | 0 refills | Status: DC
  Filled 2023-11-02 – 2024-01-07 (×2): qty 60, 30d supply, fill #0

## 2023-11-03 ENCOUNTER — Encounter: Payer: Self-pay | Admitting: Pharmacist

## 2023-11-03 ENCOUNTER — Other Ambulatory Visit: Payer: Self-pay

## 2023-11-09 ENCOUNTER — Other Ambulatory Visit: Payer: Self-pay

## 2023-11-11 ENCOUNTER — Encounter: Payer: Self-pay | Admitting: Medical-Surgical

## 2023-11-21 ENCOUNTER — Telehealth: Payer: Self-pay | Admitting: *Deleted

## 2023-11-21 NOTE — Telephone Encounter (Signed)
 Returned call from 11/20/2023. Left patient a message to call and schedule referral appointment.

## 2023-12-11 ENCOUNTER — Encounter: Payer: Self-pay | Admitting: Obstetrics & Gynecology

## 2023-12-11 ENCOUNTER — Ambulatory Visit: Admitting: Obstetrics & Gynecology

## 2023-12-11 ENCOUNTER — Other Ambulatory Visit (HOSPITAL_COMMUNITY)
Admission: RE | Admit: 2023-12-11 | Discharge: 2023-12-11 | Disposition: A | Discharge status: Home / Self Care | Source: Ambulatory Visit | Attending: Obstetrics & Gynecology | Admitting: Obstetrics & Gynecology

## 2023-12-11 VITALS — BP 109/72 | HR 71 | Ht 64.0 in | Wt 98.0 lb

## 2023-12-11 DIAGNOSIS — Z1331 Encounter for screening for depression: Secondary | ICD-10-CM

## 2023-12-11 DIAGNOSIS — N871 Moderate cervical dysplasia: Secondary | ICD-10-CM

## 2023-12-11 DIAGNOSIS — R1031 Right lower quadrant pain: Secondary | ICD-10-CM | POA: Diagnosis not present

## 2023-12-11 DIAGNOSIS — Z3202 Encounter for pregnancy test, result negative: Secondary | ICD-10-CM

## 2023-12-11 DIAGNOSIS — R87613 High grade squamous intraepithelial lesion on cytologic smear of cervix (HGSIL): Secondary | ICD-10-CM | POA: Diagnosis present

## 2023-12-11 LAB — POCT URINE PREGNANCY: Preg Test, Ur: NEGATIVE

## 2023-12-11 NOTE — Progress Notes (Signed)
   Subjective:    Patient ID: Shirley Day, female    DOB: 2001-01-05, 23 y.o.   MRN: [id number]  HPI  Pt is new to practice and presents for evaluation of abnml pap smear--May 2025 HSIL.    Review of Systems  Constitutional: Negative.   Respiratory: Negative.    Cardiovascular: Negative.   Gastrointestinal: Negative.   Genitourinary: Negative.        Objective:   Physical Exam Vitals reviewed.  Constitutional:      General: She is not in acute distress.    Appearance: She is well-developed.  HENT:     Head: Normocephalic and atraumatic.  Eyes:     Conjunctiva/sclera: Conjunctivae normal.  Cardiovascular:     Rate and Rhythm: Normal rate.  Pulmonary:     Effort: Pulmonary effort is normal.  Genitourinary:    Comments: See colposcopy note for detailed exam Skin:    General: Skin is warm and dry.  Neurological:     Mental Status: She is alert and oriented to person, place, and time.  Psychiatric:        Mood and Affect: Mood normal.    Vitals:   12/11/23 1038  BP: 109/72  Pulse: 71  Weight: 98 lb (44.5 kg)  Height: 5' 4 (1.626 m)       Assessment & Plan:  23 yo G0 with abnml pap HSIL  See colpo note.Colposcopy Procedure Note  Indications: Pap smear 1 months ago showed: high-grade squamous intraepithelial neoplasia  (HGSIL-encompassing moderate and severe dysplasia). The prior pap showed no pap on record in Epic or care Everywhere.  Prior cervical/vaginal disease: normal exam without visible pathology. Prior cervical treatment: no treatment.  Procedure Details  The risks and benefits of the procedure and Written informed consent obtained.  Speculum placed in vagina and excellent visualization of cervix achieved, cervix swabbed x 3 with acetic acid solution.  Findings: Cervix: acetowhite lesion(s) noted at 6,10,11 o'clock; cervix swabbed with Lugol's solution and AWE findings as above Vaginal inspection: vaginal colposcopy not performed. Vulvar  colposcopy: vulvar colposcopy not performed.  Specimens: bx at 6, 10, 11 and ECC  Complications: none.  Plan: Specimens labelled and sent to Pathology. Will base further treatment on Pathology findings. Post biopsy instructions given to patient.

## 2023-12-14 ENCOUNTER — Encounter: Payer: Self-pay | Admitting: Obstetrics & Gynecology

## 2023-12-14 LAB — SURGICAL PATHOLOGY

## 2023-12-18 ENCOUNTER — Ambulatory Visit: Payer: Self-pay | Admitting: Obstetrics & Gynecology

## 2023-12-18 DIAGNOSIS — N871 Moderate cervical dysplasia: Secondary | ICD-10-CM | POA: Insufficient documentation

## 2023-12-25 ENCOUNTER — Encounter: Payer: Self-pay | Admitting: Obstetrics & Gynecology

## 2023-12-25 ENCOUNTER — Ambulatory Visit (INDEPENDENT_AMBULATORY_CARE_PROVIDER_SITE_OTHER): Admitting: Obstetrics & Gynecology

## 2023-12-25 VITALS — BP 103/71 | HR 68 | Ht 64.0 in | Wt 98.0 lb

## 2023-12-25 DIAGNOSIS — N871 Moderate cervical dysplasia: Secondary | ICD-10-CM

## 2023-12-25 NOTE — Progress Notes (Signed)
   Subjective:    Patient ID: Shirley Day, female    DOB: 05/27/2001, 23 y.o.   MRN: [id number]  HPI  Shirley Day is a 23 year old G0 P0 female who presents to discuss biopsy results.  Patient's first Pap smear at the age of 23 showed high-grade SIL.  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  ECC is negative.  Patient had received 2 doses of the HPV vaccine in her teens.  Review of Systems  Constitutional: Negative.   Respiratory: Negative.    Cardiovascular: Negative.   Gastrointestinal: Negative.   Genitourinary: Negative.        Objective:   Physical Exam Vitals reviewed.  Constitutional:      General: She is not in acute distress.    Appearance: She is well-developed.  HENT:     Head: Normocephalic and atraumatic.  Eyes:     Conjunctiva/sclera: Conjunctivae normal.  Cardiovascular:     Rate and Rhythm: Normal rate.  Pulmonary:     Effort: Pulmonary effort is normal.  Skin:    General: Skin is warm and dry.  Neurological:     Mental Status: She is alert and oriented to person, place, and time.  Psychiatric:        Mood and Affect: Mood normal.    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  We had long discussion today about the etiology of cervical dysplasia.  Due to the slow-growing nature of cervical precancer and cancer, patient has had the virus for several years.  This was her first Pap smear at age 23 so we cannot for sure as to how long she has had it but it is not a recent infection.  We discussed cryotherapy versus LEEP.  Given the benefits of cryotherapy and at young ages with many years of reproduction potential, patient opts for cryo therapy over LEEP.  Patient given a handout to further discuss the treatments.  She will schedule in the next couple weeks.  The office will ensure equipment is ready and  available for day of service.

## 2024-01-08 ENCOUNTER — Other Ambulatory Visit (HOSPITAL_COMMUNITY): Payer: Self-pay

## 2024-01-08 ENCOUNTER — Other Ambulatory Visit: Payer: Self-pay

## 2024-03-25 ENCOUNTER — Encounter: Payer: Self-pay | Admitting: Obstetrics & Gynecology

## 2024-03-25 ENCOUNTER — Ambulatory Visit: Admitting: Obstetrics & Gynecology

## 2024-03-25 VITALS — BP 99/64 | HR 66 | Ht 64.0 in | Wt 99.0 lb

## 2024-03-25 DIAGNOSIS — N871 Moderate cervical dysplasia: Secondary | ICD-10-CM

## 2024-03-25 DIAGNOSIS — Z3202 Encounter for pregnancy test, result negative: Secondary | ICD-10-CM | POA: Diagnosis not present

## 2024-03-25 LAB — POCT URINE PREGNANCY: Preg Test, Ur: NEGATIVE

## 2024-03-26 NOTE — Progress Notes (Unsigned)
 GYNECOLOGY CLINIC PROCEDURE NOTE  Cryotherapy details The indications for cryotherapy were reviewed with the patient in detail. She was counseled about that efficacy of this procedure, and possible need for excisional procedure in the future if her cervical dysplasia persists.  The risks of the procedure where explained in detail and patient was told to expect a copious amount of discharge in the next few weeks. All her questions were answered, and written informed consent was obtained.  The patient was placed in the dorsal lithotomy position and a vaginal speculum was placed. Her cervix was visualized and patient was noted to have had normal size transformation zone. The appropriate cryotherapy probe was picked and affixed to cryotherapy apparatus. Then nitrogen gas was then activated, the probe was coated with lubricating jelly and applied to the transformation zone of the cervix. This was kept in place for 3 minutes. The cryotherapy was then stopped and all instruments were removed from the patient's pelvis; a thawing period of 5 minutes was observed.  A second cycle of cryotherapy was then administered to the cervix for 2 minutes.  The procedure was stopped at 2 minutes when the nitrogen gas was obscuring view of cervix.  I could not see if there was adjacent tissue that was being affected by liquid nitrogen.

## 2024-04-05 ENCOUNTER — Encounter: Payer: Self-pay | Admitting: Medical-Surgical

## 2024-04-05 DIAGNOSIS — F418 Other specified anxiety disorders: Secondary | ICD-10-CM

## 2024-07-09 ENCOUNTER — Ambulatory Visit: Admitting: Medical-Surgical

## 2024-07-09 VITALS — BP 105/67 | HR 106 | Temp 98.9°F | Resp 20 | Ht 64.0 in | Wt 101.1 lb

## 2024-07-09 DIAGNOSIS — F418 Other specified anxiety disorders: Secondary | ICD-10-CM

## 2024-07-09 DIAGNOSIS — J029 Acute pharyngitis, unspecified: Secondary | ICD-10-CM

## 2024-07-09 LAB — POCT RAPID STREP A: Rapid Strep A Screen: NEGATIVE

## 2024-07-09 LAB — POCT RAPID STREP A (OFFICE): Rapid Strep A Screen: NEGATIVE

## 2024-07-09 MED ORDER — BUSPIRONE HCL 10 MG PO TABS: 20.0000 mg | ORAL_TABLET | Freq: Two times a day (BID) | ORAL | 0 refills | Status: AC

## 2024-07-09 MED ORDER — AMOXICILLIN-POT CLAVULANATE 875-125 MG PO TABS: 1.0000 | ORAL_TABLET | Freq: Two times a day (BID) | ORAL | 0 refills | Status: AC

## 2024-07-09 MED ORDER — FLUOXETINE HCL 20 MG PO CAPS: 20.0000 mg | ORAL_CAPSULE | Freq: Every day | ORAL | 3 refills | Status: AC

## 2024-07-09 NOTE — Progress Notes (Signed)
" ° °       Established patient visit   History of Present Illness   Discussed the use of AI scribe software for clinical note transcription with the patient, who gave verbal consent to proceed.  History of Present Illness   Shirley Day is a 24 year old female who presents with a persistent sore throat and associated symptoms for three weeks.  Pharyngitis and upper respiratory symptoms - Sore throat present for three weeks, improved for two days then recurred - Associated symptoms include headaches, body aches, sinus drainage, clear nasal discharge, clogged ears, postnasal drip, and morning eye crusting - Intermittent sensation of feeling hot, temperature not checked - Cough occurs only with throat irritation - DayQuil and tea used without relief  Psychological symptoms - Anxiety has worsened following grandmothers death over the past weekend - Discontinued fluoxetine  20 mg and Buspar  when symptoms improved, has not refilled medications  - Tolerated the medications well and felt they were effective - Interested in restarting medication     Physical Exam   Physical Exam Vitals reviewed.  Constitutional:      General: She is not in acute distress.    Appearance: Normal appearance. She is ill-appearing.  HENT:     Head: Normocephalic and atraumatic.     Right Ear: Ear canal and external ear normal. A middle ear effusion (TMs dull) is present. There is no impacted cerumen.     Left Ear: Ear canal and external ear normal. A middle ear effusion (TMs dull) is present. There is no impacted cerumen.     Nose:     Right Sinus: Maxillary sinus tenderness and frontal sinus tenderness present.     Left Sinus: Maxillary sinus tenderness and frontal sinus tenderness present.  Cardiovascular:     Rate and Rhythm: Normal rate and regular rhythm.     Pulses: Normal pulses.     Heart sounds: Normal heart sounds. No murmur heard.    No friction rub. No gallop.  Pulmonary:     Effort:  Pulmonary effort is normal. No respiratory distress.     Breath sounds: Normal breath sounds. No wheezing.  Skin:    General: Skin is warm and dry.  Neurological:     Mental Status: She is alert and oriented to person, place, and time.  Psychiatric:        Mood and Affect: Mood normal.        Behavior: Behavior normal.        Thought Content: Thought content normal.        Judgment: Judgment normal.    Assessment & Plan   Acute non-recurrent pansinusitis Likely bacterial sinus infection due to persistent symptoms for three weeks. Strep throat considered but less likely. - Swabbed for strep throat- negative. - Treating with Augmentin  BID x 7 days. - Continue home remedies and OTC medications for symptom management as needed.  - If symptoms persist or do not fully resolve, consider adding prednisone  taper/burst.  Anxiety with depression Prior baseline anxiety and depression doing well without medication until her grandmother passed away recently. Interested in restarting medications. Prior treatment with fluoxetine  and buspar  effective.  - Prescribed fluoxetine  20 mg. - Prescribed Buspar .     Follow up   Return in about 6 months (around 01/06/2025) for mood follow up. __________________________________ Zada FREDRIK Palin, DNP, APRN, FNP-BC Primary [facility] Sports [facility] [place] "

## 2025-01-06 ENCOUNTER — Ambulatory Visit: Admitting: Medical-Surgical
